# Patient Record
Sex: Female | Born: 1945 | Race: White | Hispanic: No | Smoking: Former smoker
Health system: Southern US, Community
[De-identification: ages and names within clinical notes are randomized; demographics above are authoritative.]

## PROBLEM LIST (undated history)

## (undated) ENCOUNTER — Emergency Department (HOSPITAL_COMMUNITY): Disposition: A | Discharge status: Home / Self Care | Payer: Medicare Other

## (undated) DIAGNOSIS — I499 Cardiac arrhythmia, unspecified: Secondary | ICD-10-CM

## (undated) DIAGNOSIS — M797 Fibromyalgia: Secondary | ICD-10-CM

## (undated) DIAGNOSIS — F0390 Unspecified dementia without behavioral disturbance: Secondary | ICD-10-CM

## (undated) DIAGNOSIS — K589 Irritable bowel syndrome without diarrhea: Secondary | ICD-10-CM

## (undated) DIAGNOSIS — K219 Gastro-esophageal reflux disease without esophagitis: Secondary | ICD-10-CM

## (undated) DIAGNOSIS — K5190 Ulcerative colitis, unspecified, without complications: Secondary | ICD-10-CM

## (undated) DIAGNOSIS — F329 Major depressive disorder, single episode, unspecified: Secondary | ICD-10-CM

## (undated) DIAGNOSIS — I00 Rheumatic fever without heart involvement: Secondary | ICD-10-CM

## (undated) DIAGNOSIS — I639 Cerebral infarction, unspecified: Secondary | ICD-10-CM

## (undated) DIAGNOSIS — F419 Anxiety disorder, unspecified: Secondary | ICD-10-CM

## (undated) DIAGNOSIS — M353 Polymyalgia rheumatica: Secondary | ICD-10-CM

## (undated) DIAGNOSIS — E079 Disorder of thyroid, unspecified: Secondary | ICD-10-CM

## (undated) DIAGNOSIS — I10 Essential (primary) hypertension: Secondary | ICD-10-CM

## (undated) DIAGNOSIS — J45909 Unspecified asthma, uncomplicated: Secondary | ICD-10-CM

## (undated) DIAGNOSIS — E119 Type 2 diabetes mellitus without complications: Secondary | ICD-10-CM

## (undated) DIAGNOSIS — F32A Depression, unspecified: Secondary | ICD-10-CM

## (undated) DIAGNOSIS — H409 Unspecified glaucoma: Secondary | ICD-10-CM

## (undated) DIAGNOSIS — I2510 Atherosclerotic heart disease of native coronary artery without angina pectoris: Secondary | ICD-10-CM

## (undated) DIAGNOSIS — E039 Hypothyroidism, unspecified: Secondary | ICD-10-CM

## (undated) HISTORY — DX: Ulcerative colitis, unspecified, without complications: K51.90

## (undated) HISTORY — PX: ELBOW DEBRIDEMENT: SHX931

## (undated) HISTORY — DX: Polymyalgia rheumatica: M35.3

## (undated) HISTORY — PX: CHOLECYSTECTOMY: SHX55

## (undated) HISTORY — PX: THYROID SURGERY: SHX805

## (undated) HISTORY — PX: ABDOMINAL HYSTERECTOMY: SHX81

---

## 2001-04-28 ENCOUNTER — Other Ambulatory Visit: Admission: RE | Admit: 2001-04-28 | Discharge: 2001-04-28 | Payer: Self-pay | Admitting: Orthopaedic Surgery

## 2011-02-05 HISTORY — PX: COLONOSCOPY: SHX174

## 2011-03-01 ENCOUNTER — Ambulatory Visit: Payer: Self-pay | Admitting: Endocrinology

## 2011-06-05 ENCOUNTER — Encounter: Payer: Self-pay | Admitting: Gastroenterology

## 2012-10-19 ENCOUNTER — Other Ambulatory Visit: Payer: Self-pay | Admitting: Neurosurgery

## 2012-10-19 DIAGNOSIS — M549 Dorsalgia, unspecified: Secondary | ICD-10-CM

## 2013-03-12 ENCOUNTER — Other Ambulatory Visit: Payer: Self-pay | Admitting: Neurosurgery

## 2013-03-12 DIAGNOSIS — M48061 Spinal stenosis, lumbar region without neurogenic claudication: Secondary | ICD-10-CM

## 2013-03-15 ENCOUNTER — Ambulatory Visit
Admission: RE | Admit: 2013-03-15 | Discharge: 2013-03-15 | Disposition: A | Discharge status: Home / Self Care | Payer: Medicare Other | Source: Ambulatory Visit | Attending: Neurosurgery | Admitting: Neurosurgery

## 2013-03-15 VITALS — BP 86/34 | HR 70

## 2013-03-15 DIAGNOSIS — M48061 Spinal stenosis, lumbar region without neurogenic claudication: Secondary | ICD-10-CM

## 2013-03-15 MED ORDER — IOHEXOL 180 MG/ML  SOLN
15.0000 mL | Freq: Once | INTRAMUSCULAR | Status: AC | PRN
Start: 2013-03-15 — End: 2013-03-15
  Administered 2013-03-15: 15 mL via INTRATHECAL

## 2013-03-15 MED ORDER — MEPERIDINE HCL 100 MG/ML IJ SOLN
75.0000 mg | Freq: Once | INTRAMUSCULAR | Status: AC
  Administered 2013-03-15: 75 mg via INTRAMUSCULAR

## 2013-03-15 MED ORDER — DIAZEPAM 5 MG PO TABS
5.0000 mg | ORAL_TABLET | Freq: Once | ORAL | Status: AC
  Administered 2013-03-15: 5 mg via ORAL

## 2013-03-15 MED ORDER — ONDANSETRON HCL 4 MG/2ML IJ SOLN
4.0000 mg | Freq: Once | INTRAMUSCULAR | Status: AC
  Administered 2013-03-15: 4 mg via INTRAMUSCULAR

## 2013-03-15 NOTE — Discharge Instructions (Signed)
Myelogram Discharge Instructions  1. Go home and rest quietly for the next 24 hours.  It is important to lie flat for the next 24 hours.  Get up only to go to the restroom.  You may lie in the bed or on a couch on your back, your stomach, your left side or your right side.  You may have one pillow under your head.  You may have pillows between your knees while you are on your side or under your knees while you are on your back.  2. DO NOT drive today.  Recline the seat as far back as it will go, while still wearing your seat belt, on the way home.  3. You may get up to go to the bathroom as needed.  You may sit up for 10 minutes to eat.  You may resume your normal diet and medications unless otherwise indicated.  Drink lots of extra fluids today and tomorrow.  4. The incidence of headache, nausea, or vomiting is about 5% (one in 20 patients).  If you develop a headache, lie flat and drink plenty of fluids until the headache goes away.  Caffeinated beverages may be helpful.  If you develop severe nausea and vomiting or a headache that does not go away with flat bed rest, call [phone].  5. You may resume normal activities after your 24 hours of bed rest is over; however, do not exert yourself strongly or do any heavy lifting tomorrow. If when you get up you have a headache when standing, go back to bed and force fluids for another 24 hours.  6. Call your physician for a follow-up appointment.  The results of your myelogram will be sent directly to your physician by the following day.  7. If you have any questions or if complications develop after you arrive home, please call [phone].  Discharge instructions have been explained to the patient.  The patient, or the person responsible for the patient, fully understands these instructions.      May resume imitrex on Feb. 10, 2015 after 1:00 pm.

## 2013-03-15 NOTE — Progress Notes (Signed)
Pt returned from myelo, skin is warm and dry and pt is awake and alert.

## 2013-03-15 NOTE — Progress Notes (Addendum)
Patient states she has been off Imitrex for at least the past two days.  Patient states she took a Benadryl at 11:00am today.

## 2014-01-25 ENCOUNTER — Emergency Department (HOSPITAL_COMMUNITY): Payer: Medicare Other

## 2014-01-25 ENCOUNTER — Emergency Department (HOSPITAL_COMMUNITY)
Admission: EM | Admit: 2014-01-25 | Discharge: 2014-01-25 | Disposition: A | Discharge status: Home / Self Care | Payer: Medicare Other | Attending: Emergency Medicine | Admitting: Emergency Medicine

## 2014-01-25 ENCOUNTER — Encounter (HOSPITAL_COMMUNITY): Payer: Self-pay | Admitting: Emergency Medicine

## 2014-01-25 DIAGNOSIS — R110 Nausea: Secondary | ICD-10-CM | POA: Insufficient documentation

## 2014-01-25 DIAGNOSIS — Z79899 Other long term (current) drug therapy: Secondary | ICD-10-CM | POA: Insufficient documentation

## 2014-01-25 DIAGNOSIS — Z8739 Personal history of other diseases of the musculoskeletal system and connective tissue: Secondary | ICD-10-CM | POA: Insufficient documentation

## 2014-01-25 DIAGNOSIS — E119 Type 2 diabetes mellitus without complications: Secondary | ICD-10-CM | POA: Diagnosis not present

## 2014-01-25 DIAGNOSIS — I2510 Atherosclerotic heart disease of native coronary artery without angina pectoris: Secondary | ICD-10-CM | POA: Insufficient documentation

## 2014-01-25 DIAGNOSIS — R1013 Epigastric pain: Secondary | ICD-10-CM | POA: Diagnosis not present

## 2014-01-25 DIAGNOSIS — Z87891 Personal history of nicotine dependence: Secondary | ICD-10-CM | POA: Diagnosis not present

## 2014-01-25 DIAGNOSIS — R42 Dizziness and giddiness: Secondary | ICD-10-CM | POA: Insufficient documentation

## 2014-01-25 DIAGNOSIS — E079 Disorder of thyroid, unspecified: Secondary | ICD-10-CM | POA: Diagnosis not present

## 2014-01-25 DIAGNOSIS — Z9049 Acquired absence of other specified parts of digestive tract: Secondary | ICD-10-CM | POA: Insufficient documentation

## 2014-01-25 DIAGNOSIS — R109 Unspecified abdominal pain: Secondary | ICD-10-CM | POA: Diagnosis present

## 2014-01-25 DIAGNOSIS — Z8719 Personal history of other diseases of the digestive system: Secondary | ICD-10-CM | POA: Insufficient documentation

## 2014-01-25 HISTORY — DX: Disorder of thyroid, unspecified: E07.9

## 2014-01-25 HISTORY — DX: Atherosclerotic heart disease of native coronary artery without angina pectoris: I25.10

## 2014-01-25 HISTORY — DX: Type 2 diabetes mellitus without complications: E11.9

## 2014-01-25 HISTORY — DX: Irritable bowel syndrome, unspecified: K58.9

## 2014-01-25 HISTORY — DX: Fibromyalgia: M79.7

## 2014-01-25 LAB — COMPREHENSIVE METABOLIC PANEL
ALT: 51 U/L — ABNORMAL HIGH (ref 0–35)
ANION GAP: 9 (ref 5–15)
AST: 55 U/L — ABNORMAL HIGH (ref 0–37)
Albumin: 4.1 g/dL (ref 3.5–5.2)
Alkaline Phosphatase: 75 U/L (ref 39–117)
BILIRUBIN TOTAL: 0.7 mg/dL (ref 0.3–1.2)
BUN: 11 mg/dL (ref 6–23)
CO2: 23 mmol/L (ref 19–32)
CREATININE: 0.7 mg/dL (ref 0.50–1.10)
Calcium: 9.5 mg/dL (ref 8.4–10.5)
Chloride: 106 mEq/L (ref 96–112)
GFR calc Af Amer: >90 mL/min (ref >90)
GFR calc non Af Amer: 86 mL/min — ABNORMAL LOW (ref >90)
Glucose, Bld: 129 mg/dL — ABNORMAL HIGH (ref 70–99)
Potassium: 3.4 mmol/L — ABNORMAL LOW (ref 3.5–5.1)
Sodium: 138 mmol/L (ref 135–145)
TOTAL PROTEIN: 7.2 g/dL (ref 6.0–8.3)

## 2014-01-25 LAB — LIPASE, BLOOD: LIPASE: 26 U/L (ref 11–59)

## 2014-01-25 LAB — CBC WITH DIFFERENTIAL/PLATELET
Basophils Absolute: 0 10*3/uL (ref 0.0–0.1)
Basophils Relative: 0 % (ref 0–1)
EOS ABS: 0.1 10*3/uL (ref 0.0–0.7)
Eosinophils Relative: 1 % (ref 0–5)
HCT: 45.3 % (ref 36.0–46.0)
HEMOGLOBIN: 15.8 g/dL — AB (ref 12.0–15.0)
Lymphocytes Relative: 39 % (ref 12–46)
Lymphs Abs: 4.4 10*3/uL — ABNORMAL HIGH (ref 0.7–4.0)
MCH: 30.9 pg (ref 26.0–34.0)
MCHC: 34.9 g/dL (ref 30.0–36.0)
MCV: 88.6 fL (ref 78.0–100.0)
MONO ABS: 1.2 10*3/uL — AB (ref 0.1–1.0)
MONOS PCT: 11 % (ref 3–12)
Neutro Abs: 5.5 10*3/uL (ref 1.7–7.7)
Neutrophils Relative %: 49 % (ref 43–77)
Platelets: 200 10*3/uL (ref 150–400)
RBC: 5.11 MIL/uL (ref 3.87–5.11)
RDW: 12.3 % (ref 11.5–15.5)
WBC: 11.2 10*3/uL — ABNORMAL HIGH (ref 4.0–10.5)

## 2014-01-25 MED ORDER — ONDANSETRON HCL 4 MG/2ML IJ SOLN
4.0000 mg | Freq: Once | INTRAMUSCULAR | Status: AC
  Administered 2014-01-25: 4 mg via INTRAVENOUS
  Filled 2014-01-25: qty 2

## 2014-01-25 MED ORDER — ONDANSETRON 4 MG PO TBDP: 4.0000 mg | ORAL_TABLET | Freq: Three times a day (TID) | ORAL | Status: DC | PRN

## 2014-01-25 MED ORDER — FAMOTIDINE 20 MG PO TABS: 20.0000 mg | ORAL_TABLET | Freq: Two times a day (BID) | ORAL | Status: DC

## 2014-01-25 MED ORDER — HYDROMORPHONE HCL 1 MG/ML IJ SOLN
0.5000 mg | Freq: Once | INTRAMUSCULAR | Status: AC
  Administered 2014-01-25: 0.5 mg via INTRAVENOUS
  Filled 2014-01-25: qty 1

## 2014-01-25 MED ORDER — SODIUM CHLORIDE 0.9 % IV SOLN
INTRAVENOUS | Status: DC
  Administered 2014-01-25: 14:00:00 via INTRAVENOUS

## 2014-01-25 MED ORDER — TRAMADOL HCL 50 MG PO TABS: 50.0000 mg | ORAL_TABLET | Freq: Four times a day (QID) | ORAL | Status: DC | PRN

## 2014-01-25 MED ORDER — SODIUM CHLORIDE 0.9 % IV BOLUS (SEPSIS)
250.0000 mL | Freq: Once | INTRAVENOUS | Status: AC
  Administered 2014-01-25: 250 mL via INTRAVENOUS

## 2014-01-25 MED ORDER — PANTOPRAZOLE SODIUM 40 MG IV SOLR
40.0000 mg | Freq: Once | INTRAVENOUS | Status: AC
  Administered 2014-01-25: 40 mg via INTRAVENOUS
  Filled 2014-01-25: qty 40

## 2014-01-25 NOTE — ED Notes (Signed)
PT c/o epigastric pain and constant nausea x5 days. PT denies any vomiting or diarrhea. Last BM today normal as reported by PT. PT denies any Urinary symptoms.

## 2014-01-25 NOTE — ED Notes (Signed)
MD at bedside. 

## 2014-01-25 NOTE — Discharge Instructions (Signed)
Workup with a CT scan and labs without any specific findings. This may mean that this is related to a stomach ulcer. Take the Pepcid as directed. Return for any new or worse symptoms. Take pain medicine as needed. Follow-up with your doctor in the next few days or after the holidays. Take the Pepcid at least for the next 2 weeks.

## 2014-01-25 NOTE — ED Notes (Signed)
Pt reports onset of abdominal pain on Thurs. Nausea and RUQ abdominal pain. No emesis. Pt has hx of pancreatitis.

## 2014-01-25 NOTE — ED Provider Notes (Signed)
CSN: [id number]     Arrival date & time 01/25/14  1233 History  This chart was scribed for Fredia Sorrow, MD by Zola Button, ED Scribe. This patient was seen in room [facility]17/[facility] and the patient's care was started at 1:40 PM.      Chief Complaint  Patient presents with  . Abdominal Pain    Patient is a 68 y.o. female presenting with abdominal pain. The history is provided by the patient. No language interpreter was used.  Abdominal Pain Pain location:  Epigastric Pain radiates to:  Does not radiate Onset quality:  Gradual Duration:  5 days Timing:  Intermittent Progression:  Waxing and waning Chronicity:  New Relieved by:  None tried Worsened by:  Eating Ineffective treatments:  None tried Associated symptoms: cough, nausea and shortness of breath   Associated symptoms: no chest pain, no chills, no diarrhea, no dysuria, no fever and no vomiting    HPI Comments: Tina Schultz is a 68 y.o. female with a hx of cholecystectomy, several stomach ulcers and DM who presents to the Emergency Department complaining of gradual onset, intermittent, non-radiating epigastric abdominal pain that began 5 days ago. The pain is described as a 8/10 when most severe and 3/10 when least severe. Patient has been eating less because eating worsens the pain; she feels increased pain immediately after eating. She also reports having cough for a month due to asthmatic bronchitis, SOB and swelling in both ankles. She denies fever, chills, vomiting, and diarrhea.   Patient is newly diabetic; she was started on metformin 10 days ago. She had concern for pancreatitis with her current symptoms, but she does not have hx of pancreatitis.  PCP: Dr. Kern Alberta in [place]  Past Medical History  Diagnosis Date  . Diabetes mellitus without complication   . Fibromyalgia   . Thyroid disease   . Coronary artery disease   . IBS (irritable bowel syndrome)    Past Surgical History  Procedure Laterality Date  .  Thyroid surgery    . Abdominal hysterectomy    . Cholecystectomy    . Elbow debridement     Family History  Problem Relation Age of Onset  . Cancer Mother   . Stroke Father   . Addison's disease Sister   . Alzheimer's disease Brother    History  Substance Use Topics  . Smoking status: Former Research scientist (life sciences)  . Smokeless tobacco: Never Used  . Alcohol Use: No   OB History    Gravida Para Term Preterm AB TAB SAB Ectopic Multiple Living   4 3 3  1  1         Review of Systems  Constitutional: Negative for fever and chills.  Eyes: Negative for visual disturbance.  Respiratory: Positive for cough and shortness of breath.   Cardiovascular: Positive for leg swelling. Negative for chest pain.  Gastrointestinal: Positive for nausea and abdominal pain. Negative for vomiting and diarrhea.  Genitourinary: Negative for dysuria.  Musculoskeletal: Negative for back pain.  Skin: Negative for rash.  Neurological: Positive for dizziness and light-headedness. Negative for syncope and headaches.  Hematological: Does not bruise/bleed easily.  Psychiatric/Behavioral: Negative for confusion.      Allergies  Contrast media; Aspirin; Codeine; Morphine and related; and Sulfa antibiotics  Home Medications   Prior to Admission medications   Medication Sig Start Date End Date Taking? Authorizing Provider  atenolol (TENORMIN) 50 MG tablet Take 50 mg by mouth daily.   Yes Historical Provider, MD  gabapentin (NEURONTIN) 600  MG tablet Take 600 mg by mouth 3 (three) times daily.   Yes Historical Provider, MD  levothyroxine (SYNTHROID, LEVOTHROID) 112 MCG tablet Take 112 mcg by mouth daily before breakfast.   Yes Historical Provider, MD  lisinopril (PRINIVIL,ZESTRIL) 5 MG tablet Take 5 mg by mouth daily.   Yes Historical Provider, MD  metFORMIN (GLUCOPHAGE-XR) 500 MG 24 hr tablet Take 1,000 mg by mouth daily with breakfast.   Yes Historical Provider, MD  omeprazole (PRILOSEC) 20 MG capsule Take 20 mg by mouth  daily.   Yes Historical Provider, MD  famotidine (PEPCID) 20 MG tablet Take 1 tablet (20 mg total) by mouth 2 (two) times daily. 01/25/14   Fredia Sorrow, MD  ondansetron (ZOFRAN ODT) 4 MG disintegrating tablet Take 1 tablet (4 mg total) by mouth every 8 (eight) hours as needed. 01/25/14   Fredia Sorrow, MD  traMADol (ULTRAM) 50 MG tablet Take 1 tablet (50 mg total) by mouth every 6 (six) hours as needed. 01/25/14   Fredia Sorrow, MD   BP 157/90 mmHg  Pulse 104  Temp(Src) 98 F (36.7 C)  Resp 17  Ht 5' 4"  (1.626 m)  Wt 174 lb (78.926 kg)  BMI 29.85 kg/m2  SpO2 94% Physical Exam  Constitutional: She is oriented to person, place, and time. She appears well-developed and well-nourished. No distress.  HENT:  Head: Normocephalic and atraumatic.  Mouth/Throat: Oropharynx is clear and moist. No oropharyngeal exudate.  Eyes: Pupils are equal, round, and reactive to light.  Neck: Neck supple.  Cardiovascular: Normal rate, regular rhythm and normal heart sounds.   Pulmonary/Chest: Effort normal and breath sounds normal. No respiratory distress. She has no wheezes. She has no rales.  Abdominal: Bowel sounds are normal. There is tenderness. There is no guarding.  Tenderness in epigastric region, no tenderness elsewhere. No guarding.  Musculoskeletal: She exhibits no edema.  Neurological: She is alert and oriented to person, place, and time. No cranial nerve deficit.  Skin: Skin is warm and dry. No rash noted.  Psychiatric: She has a normal mood and affect. Her behavior is normal.  Nursing note and vitals reviewed.   ED Course  Procedures  DIAGNOSTIC STUDIES: Oxygen Saturation is 98% on room air, normal by my interpretation.    COORDINATION OF CARE: 1:48 PM-Discussed treatment plan which includes CXR, CT Abdomen Pelvis, medications and labs with pt at bedside and pt agreed to plan.    Labs Review Labs Reviewed  CBC WITH DIFFERENTIAL - Abnormal; Notable for the following:    WBC  11.2 (*)    Hemoglobin 15.8 (*)    Lymphs Abs 4.4 (*)    Monocytes Absolute 1.2 (*)    All other components within normal limits  COMPREHENSIVE METABOLIC PANEL - Abnormal; Notable for the following:    Potassium 3.4 (*)    Glucose, Bld 129 (*)    AST 55 (*)    ALT 51 (*)    GFR calc non Af Amer 86 (*)    All other components within normal limits  LIPASE, BLOOD    Imaging Review Ct Abdomen Pelvis Wo Contrast  01/25/2014   CLINICAL DATA:  68 year old female with epigastric pain, bloating and diarrhea.  EXAM: CT ABDOMEN AND PELVIS WITHOUT CONTRAST  TECHNIQUE: Multidetector CT imaging of the abdomen and pelvis was performed following the standard protocol without IV contrast.  COMPARISON:  Prior CT scan of the chest 01/27/2007  FINDINGS: Lower Chest: Small cluster of tree-in-bud nodularity in the inferolateral aspect of the  left lower lobe. Largest nodular component measures up to 5 mm. Otherwise, the lung bases are clear. The visualized heart is within normal limits for size. No pericardial effusion. Unremarkable visualized distal thoracic esophagus.  Abdomen: Unenhanced CT was performed per clinician order. Lack of IV contrast limits sensitivity and specificity, especially for evaluation of abdominal/pelvic solid viscera. Within these limitations, unremarkable CT appearance of the stomach, duodenum, spleen, adrenal glands and pancreas. Normal hepatic contour and morphology. No discrete hepatic lesion. Gallbladder is surgically absent. No intra or extrahepatic biliary ductal dilatation.  Unremarkable appearance of the bilateral kidneys. No focal solid lesion, hydronephrosis or nephrolithiasis.  Colonic diverticular disease without CT evidence of active inflammation. The appendix is surgically absent. No focal bowel wall thickening or evidence of obstruction. No free fluid or suspicious adenopathy.  Pelvis: Surgical changes of prior hysterectomy. The bladder is unremarkable. No free fluid or suspicious  adenopathy.  Bones/Soft Tissues: No acute fracture or aggressive appearing lytic or blastic osseous lesion. Multilevel degenerative disc disease with vacuum disc phenomenon at multiple levels.  Vascular: Limited evaluation in the absence of intravenous contrast. Scattered atherosclerotic vascular calcifications. No aneurysmal dilatation.  IMPRESSION: 1. No acute abnormality in the abdomen or pelvis to explain the patient's clinical symptoms. 2. Small isolated focus of tree-in-bud nodularity in the inferolateral aspect of the left lower lobe may represent the sequelae of a prior infectious/inflammatory or granulomatous process, or potentially an acute infectious/inflammatory process. The largest nodular component measures 5 mm in diameter. If the patient is at high risk for bronchogenic carcinoma, follow-up chest CT at 6-12 months is recommended. If the patient is at low risk for bronchogenic carcinoma, follow-up chest CT at 12 months is recommended. This recommendation follows the consensus statement: Guidelines for Management of Small Pulmonary Nodules Detected on CT Scans: A Statement from the [employer] as published in Radiology 2005;237:395-400. 3. Mild atherosclerotic vascular calcifications without aneurysmal dilatation. 4. Multilevel degenerative disc disease.   Electronically Signed   By: Jacqulynn Cadet M.D.   On: 01/25/2014 17:15   Dg Chest 2 View  01/25/2014   CLINICAL DATA:  Epigastric pain and nausea for 5 days.  Dizziness.  EXAM: CHEST  2 VIEW  COMPARISON:  None.  FINDINGS: There is eventration of right hemidiaphragm. The cardiomediastinal contours are normal. Minimal linear atelectasis in the lingula, the lungs are otherwise clear. Pulmonary vasculature is normal. No consolidation, pleural effusion, or pneumothorax. No acute osseous abnormalities are seen.  IMPRESSION: No acute pulmonary process.   Electronically Signed   By: Jeb Levering M.D.   On: 01/25/2014 15:14     EKG  Interpretation   Date/Time:  Tuesday January 25 2014 14:11:59 EST Ventricular Rate:  105 PR Interval:  172 QRS Duration: 88 QT Interval:  338 QTC Calculation: 447 R Axis:   39 Text Interpretation:  Sinus tachycardia Borderline repolarization  abnormality No previous ECGs available Confirmed by Gavin Telford  MD, Malene Blaydes  [phone]) on 01/25/2014 5:29:26 PM      MDM   Final diagnoses:  Epigastric abdominal pain   Patient's workup for the epigastric abdominal pain without evidence of pancreatitis. CT scan without any significant abnormalities. May represent peptic ulcer disease. Patient's had a history of that in the past. Will treat the visit this could be peptic ulcer disease and have her follow-up with her regular doctor.  I personally performed the services described in this documentation, which was scribed in my presence. The recorded information has been reviewed and is accurate.  Fredia Sorrow, MD 01/25/14 [phone]

## 2014-08-16 ENCOUNTER — Other Ambulatory Visit (HOSPITAL_COMMUNITY): Payer: Self-pay | Admitting: Family Medicine

## 2014-08-16 DIAGNOSIS — M79605 Pain in left leg: Secondary | ICD-10-CM

## 2014-08-16 DIAGNOSIS — M545 Low back pain: Secondary | ICD-10-CM

## 2014-08-23 ENCOUNTER — Ambulatory Visit (HOSPITAL_COMMUNITY)
Admission: RE | Admit: 2014-08-23 | Discharge: 2014-08-23 | Disposition: A | Discharge status: Home / Self Care | Payer: PPO | Source: Ambulatory Visit | Attending: Family Medicine | Admitting: Family Medicine

## 2014-08-23 DIAGNOSIS — M545 Low back pain: Secondary | ICD-10-CM | POA: Diagnosis present

## 2014-08-23 DIAGNOSIS — M4806 Spinal stenosis, lumbar region: Secondary | ICD-10-CM | POA: Insufficient documentation

## 2014-08-23 DIAGNOSIS — M5136 Other intervertebral disc degeneration, lumbar region: Secondary | ICD-10-CM | POA: Insufficient documentation

## 2014-08-23 DIAGNOSIS — M129 Arthropathy, unspecified: Secondary | ICD-10-CM | POA: Diagnosis not present

## 2014-08-23 DIAGNOSIS — M79605 Pain in left leg: Secondary | ICD-10-CM

## 2015-02-16 DIAGNOSIS — J0101 Acute recurrent maxillary sinusitis: Secondary | ICD-10-CM | POA: Diagnosis not present

## 2015-02-16 DIAGNOSIS — J209 Acute bronchitis, unspecified: Secondary | ICD-10-CM | POA: Diagnosis not present

## 2015-03-08 DIAGNOSIS — Z780 Asymptomatic menopausal state: Secondary | ICD-10-CM | POA: Diagnosis not present

## 2015-03-08 DIAGNOSIS — K219 Gastro-esophageal reflux disease without esophagitis: Secondary | ICD-10-CM | POA: Diagnosis not present

## 2015-03-08 DIAGNOSIS — M8589 Other specified disorders of bone density and structure, multiple sites: Secondary | ICD-10-CM | POA: Diagnosis not present

## 2015-03-08 DIAGNOSIS — Z87891 Personal history of nicotine dependence: Secondary | ICD-10-CM | POA: Diagnosis not present

## 2015-03-08 DIAGNOSIS — E119 Type 2 diabetes mellitus without complications: Secondary | ICD-10-CM | POA: Diagnosis not present

## 2015-03-08 DIAGNOSIS — M8580 Other specified disorders of bone density and structure, unspecified site: Secondary | ICD-10-CM | POA: Diagnosis not present

## 2015-03-08 DIAGNOSIS — E039 Hypothyroidism, unspecified: Secondary | ICD-10-CM | POA: Diagnosis not present

## 2015-03-08 DIAGNOSIS — Z79899 Other long term (current) drug therapy: Secondary | ICD-10-CM | POA: Diagnosis not present

## 2015-03-14 DIAGNOSIS — H401131 Primary open-angle glaucoma, bilateral, mild stage: Secondary | ICD-10-CM | POA: Diagnosis not present

## 2015-04-15 DIAGNOSIS — J0191 Acute recurrent sinusitis, unspecified: Secondary | ICD-10-CM | POA: Diagnosis not present

## 2015-04-15 DIAGNOSIS — E039 Hypothyroidism, unspecified: Secondary | ICD-10-CM | POA: Diagnosis not present

## 2015-04-15 DIAGNOSIS — E1165 Type 2 diabetes mellitus with hyperglycemia: Secondary | ICD-10-CM | POA: Diagnosis not present

## 2015-05-22 DIAGNOSIS — E1165 Type 2 diabetes mellitus with hyperglycemia: Secondary | ICD-10-CM | POA: Diagnosis not present

## 2015-05-22 DIAGNOSIS — I10 Essential (primary) hypertension: Secondary | ICD-10-CM | POA: Diagnosis not present

## 2015-05-22 DIAGNOSIS — K219 Gastro-esophageal reflux disease without esophagitis: Secondary | ICD-10-CM | POA: Diagnosis not present

## 2015-05-22 DIAGNOSIS — E038 Other specified hypothyroidism: Secondary | ICD-10-CM | POA: Diagnosis not present

## 2015-05-24 DIAGNOSIS — F331 Major depressive disorder, recurrent, moderate: Secondary | ICD-10-CM | POA: Diagnosis not present

## 2015-05-24 DIAGNOSIS — E039 Hypothyroidism, unspecified: Secondary | ICD-10-CM | POA: Diagnosis not present

## 2015-05-24 DIAGNOSIS — E1165 Type 2 diabetes mellitus with hyperglycemia: Secondary | ICD-10-CM | POA: Diagnosis not present

## 2015-05-24 DIAGNOSIS — Z1389 Encounter for screening for other disorder: Secondary | ICD-10-CM | POA: Diagnosis not present

## 2015-05-24 DIAGNOSIS — K219 Gastro-esophageal reflux disease without esophagitis: Secondary | ICD-10-CM | POA: Diagnosis not present

## 2015-05-24 DIAGNOSIS — I10 Essential (primary) hypertension: Secondary | ICD-10-CM | POA: Diagnosis not present

## 2015-05-24 DIAGNOSIS — E8881 Metabolic syndrome: Secondary | ICD-10-CM | POA: Diagnosis not present

## 2015-05-24 DIAGNOSIS — M797 Fibromyalgia: Secondary | ICD-10-CM | POA: Diagnosis not present

## 2015-06-20 DIAGNOSIS — H2513 Age-related nuclear cataract, bilateral: Secondary | ICD-10-CM | POA: Diagnosis not present

## 2015-06-20 DIAGNOSIS — H401131 Primary open-angle glaucoma, bilateral, mild stage: Secondary | ICD-10-CM | POA: Diagnosis not present

## 2015-06-20 DIAGNOSIS — E119 Type 2 diabetes mellitus without complications: Secondary | ICD-10-CM | POA: Diagnosis not present

## 2015-09-19 DIAGNOSIS — H401131 Primary open-angle glaucoma, bilateral, mild stage: Secondary | ICD-10-CM | POA: Diagnosis not present

## 2015-10-10 DIAGNOSIS — E8881 Metabolic syndrome: Secondary | ICD-10-CM | POA: Diagnosis not present

## 2015-10-10 DIAGNOSIS — E1165 Type 2 diabetes mellitus with hyperglycemia: Secondary | ICD-10-CM | POA: Diagnosis not present

## 2015-10-10 DIAGNOSIS — Z6823 Body mass index (BMI) 23.0-23.9, adult: Secondary | ICD-10-CM | POA: Diagnosis not present

## 2015-10-10 DIAGNOSIS — E039 Hypothyroidism, unspecified: Secondary | ICD-10-CM | POA: Diagnosis not present

## 2015-10-10 DIAGNOSIS — M797 Fibromyalgia: Secondary | ICD-10-CM | POA: Diagnosis not present

## 2015-10-10 DIAGNOSIS — F331 Major depressive disorder, recurrent, moderate: Secondary | ICD-10-CM | POA: Diagnosis not present

## 2015-10-10 DIAGNOSIS — K219 Gastro-esophageal reflux disease without esophagitis: Secondary | ICD-10-CM | POA: Diagnosis not present

## 2015-10-10 DIAGNOSIS — I10 Essential (primary) hypertension: Secondary | ICD-10-CM | POA: Diagnosis not present

## 2015-12-04 DIAGNOSIS — E039 Hypothyroidism, unspecified: Secondary | ICD-10-CM | POA: Diagnosis not present

## 2015-12-14 DIAGNOSIS — Z23 Encounter for immunization: Secondary | ICD-10-CM | POA: Diagnosis not present

## 2016-01-04 DIAGNOSIS — H401131 Primary open-angle glaucoma, bilateral, mild stage: Secondary | ICD-10-CM | POA: Diagnosis not present

## 2016-02-19 DIAGNOSIS — J209 Acute bronchitis, unspecified: Secondary | ICD-10-CM | POA: Diagnosis not present

## 2016-02-19 DIAGNOSIS — J019 Acute sinusitis, unspecified: Secondary | ICD-10-CM | POA: Diagnosis not present

## 2016-02-19 DIAGNOSIS — Z6823 Body mass index (BMI) 23.0-23.9, adult: Secondary | ICD-10-CM | POA: Diagnosis not present

## 2016-03-11 DIAGNOSIS — R0602 Shortness of breath: Secondary | ICD-10-CM | POA: Diagnosis not present

## 2016-03-11 DIAGNOSIS — I10 Essential (primary) hypertension: Secondary | ICD-10-CM | POA: Diagnosis not present

## 2016-03-11 DIAGNOSIS — Z6822 Body mass index (BMI) 22.0-22.9, adult: Secondary | ICD-10-CM | POA: Diagnosis not present

## 2016-03-11 DIAGNOSIS — R072 Precordial pain: Secondary | ICD-10-CM | POA: Diagnosis not present

## 2016-03-28 ENCOUNTER — Emergency Department (HOSPITAL_COMMUNITY)
Admission: EM | Admit: 2016-03-28 | Discharge: 2016-03-28 | Disposition: A | Discharge status: Home / Self Care | Payer: PPO | Attending: Emergency Medicine | Admitting: Emergency Medicine

## 2016-03-28 ENCOUNTER — Encounter (HOSPITAL_COMMUNITY): Payer: Self-pay

## 2016-03-28 DIAGNOSIS — Z79899 Other long term (current) drug therapy: Secondary | ICD-10-CM | POA: Insufficient documentation

## 2016-03-28 DIAGNOSIS — R002 Palpitations: Secondary | ICD-10-CM | POA: Insufficient documentation

## 2016-03-28 DIAGNOSIS — I2510 Atherosclerotic heart disease of native coronary artery without angina pectoris: Secondary | ICD-10-CM | POA: Diagnosis not present

## 2016-03-28 DIAGNOSIS — E119 Type 2 diabetes mellitus without complications: Secondary | ICD-10-CM | POA: Diagnosis not present

## 2016-03-28 DIAGNOSIS — Z87891 Personal history of nicotine dependence: Secondary | ICD-10-CM | POA: Diagnosis not present

## 2016-03-28 DIAGNOSIS — R0602 Shortness of breath: Secondary | ICD-10-CM | POA: Insufficient documentation

## 2016-03-28 LAB — CBC WITH DIFFERENTIAL/PLATELET
BASOS ABS: 0 10*3/uL (ref 0.0–0.1)
BASOS PCT: 0 %
Eosinophils Absolute: 0.1 10*3/uL (ref 0.0–0.7)
Eosinophils Relative: 1 %
HCT: 40.1 % (ref 36.0–46.0)
HEMOGLOBIN: 14.1 g/dL (ref 12.0–15.0)
Lymphocytes Relative: 50 %
Lymphs Abs: 5.2 10*3/uL — ABNORMAL HIGH (ref 0.7–4.0)
MCH: 31.1 pg (ref 26.0–34.0)
MCHC: 35.2 g/dL (ref 30.0–36.0)
MCV: 88.3 fL (ref 78.0–100.0)
Monocytes Absolute: 0.5 10*3/uL (ref 0.1–1.0)
Monocytes Relative: 5 %
NEUTROS PCT: 44 %
Neutro Abs: 4.4 10*3/uL (ref 1.7–7.7)
Platelets: 226 10*3/uL (ref 150–400)
RBC: 4.54 MIL/uL (ref 3.87–5.11)
RDW: 12.4 % (ref 11.5–15.5)
WBC: 10.2 10*3/uL (ref 4.0–10.5)

## 2016-03-28 LAB — TROPONIN I

## 2016-03-28 LAB — COMPREHENSIVE METABOLIC PANEL
ALBUMIN: 4.5 g/dL (ref 3.5–5.0)
ALT: 25 U/L (ref 14–54)
ANION GAP: 9 (ref 5–15)
AST: 33 U/L (ref 15–41)
Alkaline Phosphatase: 68 U/L (ref 38–126)
BUN: 18 mg/dL (ref 6–20)
CO2: 24 mmol/L (ref 22–32)
Calcium: 9.5 mg/dL (ref 8.9–10.3)
Chloride: 103 mmol/L (ref 101–111)
Creatinine, Ser: 0.63 mg/dL (ref 0.44–1.00)
GFR calc Af Amer: >60 mL/min (ref >60)
Glucose, Bld: 124 mg/dL — ABNORMAL HIGH (ref 65–99)
POTASSIUM: 3.3 mmol/L — AB (ref 3.5–5.1)
Sodium: 136 mmol/L (ref 135–145)
Total Bilirubin: 0.4 mg/dL (ref 0.3–1.2)
Total Protein: 7.4 g/dL (ref 6.5–8.1)

## 2016-03-28 LAB — TSH: TSH: 1.167 u[IU]/mL (ref 0.350–4.500)

## 2016-03-28 LAB — BRAIN NATRIURETIC PEPTIDE: B NATRIURETIC PEPTIDE 5: 32 pg/mL (ref 0.0–100.0)

## 2016-03-28 MED ORDER — SODIUM CHLORIDE 0.9 % IV BOLUS (SEPSIS)
500.0000 mL | Freq: Once | INTRAVENOUS | Status: AC
  Administered 2016-03-28: 500 mL via INTRAVENOUS

## 2016-03-28 NOTE — Discharge Instructions (Signed)
Increase your atenolol to 50 mg daily.  Follow-up with cardiology in the next week. The contact information for the results cardiology clinic has been provided in this discharge summary for you to call and make these arrangements.  Return to the emergency department if you develop chest pain, worsening breathing, high fevers, or other new and concerning symptoms.

## 2016-03-28 NOTE — ED Triage Notes (Signed)
Reports of palpitations x6 weeks but worse today with shortness of breath.  States she has seen Dr. Olena Heckle for complaint. Denies chest pain. Patient states she can be sitting at home watching tv and feel like she is going to pass out.

## 2016-03-28 NOTE — ED Provider Notes (Signed)
[facility] DEPT Provider Note   CSN: [id number] Arrival date & time: 03/28/16  1650     History   Chief Complaint Chief Complaint  Patient presents with  . Palpitations    HPI Tina Schultz is a 71 y.o. female.  Patient is a 71 year old female with past medical history of diabetes, coronary artery disease, fibromyalgia, thyroid disease. She presents for evaluation of palpitations. This been occurring intermittently for the past 6 weeks. She was seen 2 weeks ago by a cardiologist who ran tests, however found no obvious problems. She presents today with palpitations and shortness of breath. She denies any chest pain. She denies any fevers, chills, or cough.   The history is provided by the patient.  Palpitations   This is a new problem. Episode onset: Weeks weeks ago. Episode frequency: Intermittently. The problem has been gradually worsening. Associated symptoms include shortness of breath. Pertinent negatives include no diaphoresis, no fever, no vomiting and no cough.    Past Medical History:  Diagnosis Date  . Coronary artery disease   . Diabetes mellitus without complication ([place])   . Fibromyalgia   . IBS (irritable bowel syndrome)   . Thyroid disease     There are no active problems to display for this patient.   Past Surgical History:  Procedure Laterality Date  . ABDOMINAL HYSTERECTOMY    . CHOLECYSTECTOMY    . ELBOW DEBRIDEMENT    . THYROID SURGERY      OB History    Gravida Para Term Preterm AB Living   4 3 3   1      SAB TAB Ectopic Multiple Live Births   1               Home Medications    Prior to Admission medications   Medication Sig Start Date End Date Taking? Authorizing Provider  atenolol (TENORMIN) 50 MG tablet Take 50 mg by mouth daily.    Historical Provider, MD  famotidine (PEPCID) 20 MG tablet Take 1 tablet (20 mg total) by mouth 2 (two) times daily. 01/25/14   Fredia Sorrow, MD  gabapentin (NEURONTIN) 600 MG tablet Take 600 mg  by mouth 3 (three) times daily.    Historical Provider, MD  levothyroxine (SYNTHROID, LEVOTHROID) 112 MCG tablet Take 112 mcg by mouth daily before breakfast.    Historical Provider, MD  lisinopril (PRINIVIL,ZESTRIL) 5 MG tablet Take 5 mg by mouth daily.    Historical Provider, MD  metFORMIN (GLUCOPHAGE-XR) 500 MG 24 hr tablet Take 1,000 mg by mouth daily with breakfast.    Historical Provider, MD  omeprazole (PRILOSEC) 20 MG capsule Take 20 mg by mouth daily.    Historical Provider, MD  ondansetron (ZOFRAN ODT) 4 MG disintegrating tablet Take 1 tablet (4 mg total) by mouth every 8 (eight) hours as needed. 01/25/14   Fredia Sorrow, MD  traMADol (ULTRAM) 50 MG tablet Take 1 tablet (50 mg total) by mouth every 6 (six) hours as needed. 01/25/14   Fredia Sorrow, MD    Family History Family History  Problem Relation Age of Onset  . Cancer Mother   . Stroke Father   . Addison's disease Sister   . Alzheimer's disease Brother     Social History Social History  Substance Use Topics  . Smoking status: Former Research scientist (life sciences)  . Smokeless tobacco: Never Used  . Alcohol use No     Allergies   Contrast media [iodinated diagnostic agents]; Aspirin; Codeine; Morphine and related; and Sulfa antibiotics  Review of Systems Review of Systems  Constitutional: Negative for diaphoresis and fever.  Respiratory: Positive for shortness of breath. Negative for cough.   Cardiovascular: Positive for palpitations.  Gastrointestinal: Negative for vomiting.  All other systems reviewed and are negative.    Physical Exam Updated Vital Signs BP 180/73 (BP Location: Left Arm)   Pulse 105   Temp 98.9 F (37.2 C) (Oral)   Resp 14   Ht 5' 4"  (1.626 m)   Wt 132 lb (59.9 kg)   SpO2 100%   BMI 22.66 kg/m   Physical Exam  Constitutional: She is oriented to person, place, and time. She appears well-developed and well-nourished. No distress.  HENT:  Head: Normocephalic and atraumatic.  Mouth/Throat:  Oropharynx is clear and moist.  Neck: Normal range of motion. Neck supple.  Cardiovascular: Normal rate and regular rhythm.  Exam reveals no gallop and no friction rub.   No murmur heard. Pulmonary/Chest: Effort normal and breath sounds normal. No respiratory distress. She has no wheezes.  Abdominal: Soft. Bowel sounds are normal. She exhibits no distension. There is no tenderness.  Musculoskeletal: Normal range of motion.  Neurological: She is alert and oriented to person, place, and time.  Skin: Skin is warm and dry. She is not diaphoretic.  Nursing note and vitals reviewed.    ED Treatments / Results  Labs (all labs ordered are listed, but only abnormal results are displayed) Labs Reviewed  COMPREHENSIVE METABOLIC PANEL  TROPONIN I  CBC WITH DIFFERENTIAL/PLATELET  TSH    EKG  EKG Interpretation  Date/Time:  Thursday March 28 2016 16:59:27 EST Ventricular Rate:  104 PR Interval:    QRS Duration: 91 QT Interval:  332 QTC Calculation: 437 R Axis:   67 Text Interpretation:  Sinus tachycardia Borderline repolarization abnormality Confirmed by Noura Purpura  MD, Yazmeen Woolf ([id number]) on 03/28/2016 5:02:06 PM       Radiology No results found.  Procedures Procedures (including critical care time)  Medications Ordered in ED Medications  sodium chloride 0.9 % bolus 500 mL (not administered)     Initial Impression / Assessment and Plan / ED Course  I have reviewed the triage vital signs and the nursing notes.  Pertinent labs & imaging results that were available during my care of the patient were reviewed by me and considered in my medical decision making (see chart for details).  Workup reveals no obvious cause for her palpitations. She was remained in a normal sinus rhythm throughout her emergency department stay. There is no hypoxia and laboratory studies are unremarkable. I highly doubt pulmonary embolism. She will be given the follow-up information for cardiology to discuss a  possible Holter monitor. I will also advise her to resume the normal dose of atenolol that she had been taking prior to it being decreased.  Final Clinical Impressions(s) / ED Diagnoses   Final diagnoses:  None    New Prescriptions New Prescriptions   No medications on file     Veryl Speak, MD 03/28/16 1935

## 2016-04-03 ENCOUNTER — Encounter: Payer: Self-pay | Admitting: Cardiology

## 2016-04-03 ENCOUNTER — Ambulatory Visit (INDEPENDENT_AMBULATORY_CARE_PROVIDER_SITE_OTHER): Payer: PPO | Admitting: Cardiology

## 2016-04-03 VITALS — BP 110/64 | HR 65 | Ht 64.0 in | Wt 135.0 lb

## 2016-04-03 DIAGNOSIS — E876 Hypokalemia: Secondary | ICD-10-CM | POA: Diagnosis not present

## 2016-04-03 DIAGNOSIS — R002 Palpitations: Secondary | ICD-10-CM

## 2016-04-03 MED ORDER — POTASSIUM CHLORIDE CRYS ER 20 MEQ PO TBCR: EXTENDED_RELEASE_TABLET | ORAL | 0 refills | Status: DC

## 2016-04-03 NOTE — Patient Instructions (Signed)
Your physician recommends that you schedule a follow-up appointment in: TO BE DETERMINED AFTER TESTING  Your physician has recommended you make the following change in your medication:   START POTASSIUM 20 MEQ DAILY FOR 4 DAYS  Your physician has recommended that you wear an event monitor FOR 7 DAYS. Event monitors are medical devices that record the heart's electrical activity. Doctors most often [place] these monitors to diagnose arrhythmias. Arrhythmias are problems with the speed or rhythm of the heartbeat. The monitor is a small, portable device. You can wear one while you do your normal daily activities. This is usually used to diagnose what is causing palpitations/syncope (passing out).  Thank you for choosing [facility]!!

## 2016-04-03 NOTE — Progress Notes (Signed)
Clinical Summary Tina Schultz is a 71 y.o.female seen as new patient, she is referred by Tina Schultz.   1. Palpitations - seen in ER 03/28/16 with palpitations - symptoms started 8 weeks ago. Feeling of heart pounding, can feel dizzy. Occurs at rest or with exertion. Can get get diaphoretic. No SOB no chest pain. Lasts just a few seconds. Occurs several times a day. Can awake from sleep. No specific trigger - coffee x 2cups, no tea, no sodas, no energy drinks, no EtOH. Drinks 1 bottle of water. - she reports atenolol was decreased to 25 mg daily, orthostatic in clinic. During recent ER visit increased back to 83m daily. Symptopms have not imrpoved - TSH 1.167, K 3.3, Cr 0.63   2. CAD? - CAD is mentioned in her PMH. She unclear of this diagnosis [facility].    SH: her husband is Tina Schultz also a patient of mine.   Past Medical History:  Diagnosis Date  . Coronary artery disease   . Diabetes mellitus without complication ([place]   . Fibromyalgia   . IBS (irritable bowel syndrome)   . Thyroid disease      Allergies  Allergen Reactions  . Contrast Media [Iodinated Diagnostic Agents] Nausea And Vomiting and Other (See Comments)    Increased heart rate and sweating "a long time ago."  01/25/14 pt states she "went out" after receiving iv contrast 25 yrs ago [facility]  . Aspirin Other (See Comments)    Bleeding precautions secondary to ulcerative colitis.  . Codeine Nausea And Vomiting  . Morphine And Related Nausea And Vomiting  . Sulfa Antibiotics Nausea And Vomiting     Current Outpatient Prescriptions  Medication Sig Dispense Refill  . atenolol (TENORMIN) 50 MG tablet Take 25 mg by mouth daily.     . diphenhydramine-acetaminophen (TYLENOL PM) 25-500 MG TABS tablet Take 1 tablet by mouth at bedtime as needed.    .Marland Kitchenestradiol (ESTRACE) 0.5 MG tablet Take 0.5 mg by  mouth daily.    .Marland Kitchengabapentin (NEURONTIN) 600 MG tablet Take 600 mg by mouth 3 (three) times daily.    .Marland Kitchenlatanoprost (XALATAN) 0.005 % ophthalmic solution Place 1 drop into both eyes at bedtime.    .Marland Kitchenlevothyroxine (SYNTHROID, LEVOTHROID) 88 MCG tablet Take 88 mcg by mouth daily before breakfast.     . Multiple Vitamins-Minerals (CENTRUM SILVER ADULT 50+) TABS Take 1 tablet by mouth daily.    .Marland Kitchenomeprazole (PRILOSEC) 20 MG capsule Take 20 mg by mouth daily.    .Marland Kitchenvenlafaxine (EFFEXOR) 37.5 MG tablet Take 37.5 mg by mouth 2 (two) times daily.     No current facility-administered medications for this visit.      Past Surgical History:  Procedure Laterality Date  . ABDOMINAL HYSTERECTOMY    . CHOLECYSTECTOMY    . ELBOW DEBRIDEMENT    . THYROID SURGERY       Allergies  Allergen Reactions  . Contrast Media [Iodinated Diagnostic Agents] Nausea And Vomiting and Other (See Comments)    Increased heart rate and sweating "a long time ago."  01/25/14 pt states she "went out" after receiving iv contrast 25 yrs ago and [facility]  . Aspirin Other (See Comments)    Bleeding precautions secondary to ulcerative colitis.  . Codeine Nausea And Vomiting  .  Morphine And Related Nausea And Vomiting  . Sulfa Antibiotics Nausea And Vomiting      Family History  Problem Relation Age of Onset  . Cancer Mother   . Stroke Father   . Addison's disease Sister   . Alzheimer's disease Brother      Social History Tina Schultz reports that she has quit smoking. She has never used smokeless tobacco. Tina Schultz reports that she does not drink alcohol.   Review of Systems CONSTITUTIONAL: No weight loss, fever, chills, weakness or fatigue.  HEENT: Eyes: No visual loss, blurred vision, double vision or yellow sclerae.No hearing loss, sneezing, congestion, runny nose or sore throat.  SKIN: No rash or itching.  CARDIOVASCULAR: per HPI RESPIRATORY: No shortness of  breath, cough or sputum.  GASTROINTESTINAL: No anorexia, nausea, vomiting or diarrhea. No abdominal pain or blood.  GENITOURINARY: No burning on urination, no polyuria NEUROLOGICAL: No headache, dizziness, syncope, paralysis, ataxia, numbness or tingling in the extremities. No change in bowel or bladder control.  MUSCULOSKELETAL: No muscle, back pain, joint pain or stiffness.  LYMPHATICS: No enlarged nodes. No history of splenectomy.  PSYCHIATRIC: No history of depression or anxiety.  ENDOCRINOLOGIC: No reports of sweating, cold or heat intolerance. No polyuria or polydipsia.  Marland Kitchen   Physical Examination Vitals:   04/03/16 0909  BP: 110/64  Pulse: 65   Vitals:   04/03/16 0909  Weight: 135 lb (61.2 kg)  Height: 5' 4"  (1.626 m)    Gen: resting comfortably, no acute distress HEENT: no scleral icterus, pupils equal round and reactive, no palptable cervical adenopathy,  CV: RRR, no m/r/g, no jvd Resp: Clear to auscultation bilaterally GI: abdomen is soft, non-tender, non-distended, normal bowel sounds, no hepatosplenomegaly MSK: extremities are warm, no edema.  Skin: warm, no rash Neuro:  no focal deficits Psych: appropriate affect      Assessment and Plan  1. Palpitations - baseline EKG shows NSR - we will obtain 7 day montior to furterh evaluate  2. Hypokalemia - start KCl 48mq x 4 days.    F/u pending monitor results      JArnoldo Schultz M.D.

## 2016-04-08 DIAGNOSIS — R002 Palpitations: Secondary | ICD-10-CM | POA: Diagnosis not present

## 2016-04-09 ENCOUNTER — Ambulatory Visit (INDEPENDENT_AMBULATORY_CARE_PROVIDER_SITE_OTHER): Payer: PPO

## 2016-04-09 DIAGNOSIS — R002 Palpitations: Secondary | ICD-10-CM

## 2016-04-18 ENCOUNTER — Ambulatory Visit: Payer: PPO | Admitting: Cardiology

## 2016-04-30 ENCOUNTER — Telehealth: Payer: Self-pay | Admitting: *Deleted

## 2016-04-30 MED ORDER — ATENOLOL 25 MG PO TABS: 25.0000 mg | ORAL_TABLET | Freq: Two times a day (BID) | ORAL | 1 refills | Status: DC

## 2016-04-30 NOTE — Telephone Encounter (Signed)
-----   Message from Arnoldo Lenis, MD sent at 04/30/2016 10:03 AM EDT ----- Normal heart monitor. If still having symptoms of heart racing can increase her atenolol. Verify she is taking 40m daily, if so increase to 268mbid. F/u 3 months, she should call and update usKorearior is symptoms continue   J BrancH MD

## 2016-04-30 NOTE — Telephone Encounter (Signed)
Pt aware - still c/o heart racing and dizzy spells - verified atenolol 25 mg daily and pt will increase to bid - new rx sent to [facility] as requested - 3 month f/u scheduled and pt will call back if symptoms do not improve - routed to pcp

## 2016-05-01 DIAGNOSIS — E039 Hypothyroidism, unspecified: Secondary | ICD-10-CM | POA: Diagnosis not present

## 2016-05-01 DIAGNOSIS — E1165 Type 2 diabetes mellitus with hyperglycemia: Secondary | ICD-10-CM | POA: Diagnosis not present

## 2016-05-01 DIAGNOSIS — I10 Essential (primary) hypertension: Secondary | ICD-10-CM | POA: Diagnosis not present

## 2016-05-01 DIAGNOSIS — Z0001 Encounter for general adult medical examination with abnormal findings: Secondary | ICD-10-CM | POA: Diagnosis not present

## 2016-05-01 DIAGNOSIS — E8881 Metabolic syndrome: Secondary | ICD-10-CM | POA: Diagnosis not present

## 2016-05-01 DIAGNOSIS — M797 Fibromyalgia: Secondary | ICD-10-CM | POA: Diagnosis not present

## 2016-05-01 DIAGNOSIS — Z1212 Encounter for screening for malignant neoplasm of rectum: Secondary | ICD-10-CM | POA: Diagnosis not present

## 2016-05-01 DIAGNOSIS — K219 Gastro-esophageal reflux disease without esophagitis: Secondary | ICD-10-CM | POA: Diagnosis not present

## 2016-06-18 DIAGNOSIS — H401131 Primary open-angle glaucoma, bilateral, mild stage: Secondary | ICD-10-CM | POA: Diagnosis not present

## 2016-07-08 ENCOUNTER — Ambulatory Visit: Payer: PPO | Admitting: Cardiology

## 2016-08-20 DIAGNOSIS — R51 Headache: Secondary | ICD-10-CM | POA: Diagnosis not present

## 2016-08-20 DIAGNOSIS — Z6823 Body mass index (BMI) 23.0-23.9, adult: Secondary | ICD-10-CM | POA: Diagnosis not present

## 2016-08-20 DIAGNOSIS — R202 Paresthesia of skin: Secondary | ICD-10-CM | POA: Diagnosis not present

## 2016-08-22 ENCOUNTER — Ambulatory Visit: Payer: PPO | Admitting: Cardiology

## 2016-09-05 DIAGNOSIS — L718 Other rosacea: Secondary | ICD-10-CM | POA: Diagnosis not present

## 2016-09-06 DIAGNOSIS — Z9189 Other specified personal risk factors, not elsewhere classified: Secondary | ICD-10-CM | POA: Diagnosis not present

## 2016-09-06 DIAGNOSIS — E1165 Type 2 diabetes mellitus with hyperglycemia: Secondary | ICD-10-CM | POA: Diagnosis not present

## 2016-09-06 DIAGNOSIS — E039 Hypothyroidism, unspecified: Secondary | ICD-10-CM | POA: Diagnosis not present

## 2016-09-06 DIAGNOSIS — K219 Gastro-esophageal reflux disease without esophagitis: Secondary | ICD-10-CM | POA: Diagnosis not present

## 2016-09-06 DIAGNOSIS — E8881 Metabolic syndrome: Secondary | ICD-10-CM | POA: Diagnosis not present

## 2016-09-06 DIAGNOSIS — M797 Fibromyalgia: Secondary | ICD-10-CM | POA: Diagnosis not present

## 2016-09-06 DIAGNOSIS — I10 Essential (primary) hypertension: Secondary | ICD-10-CM | POA: Diagnosis not present

## 2016-09-10 DIAGNOSIS — E1165 Type 2 diabetes mellitus with hyperglycemia: Secondary | ICD-10-CM | POA: Diagnosis not present

## 2016-09-10 DIAGNOSIS — Z0001 Encounter for general adult medical examination with abnormal findings: Secondary | ICD-10-CM | POA: Diagnosis not present

## 2016-09-10 DIAGNOSIS — I10 Essential (primary) hypertension: Secondary | ICD-10-CM | POA: Diagnosis not present

## 2016-09-10 DIAGNOSIS — Z6822 Body mass index (BMI) 22.0-22.9, adult: Secondary | ICD-10-CM | POA: Diagnosis not present

## 2016-09-10 DIAGNOSIS — E039 Hypothyroidism, unspecified: Secondary | ICD-10-CM | POA: Diagnosis not present

## 2016-09-10 DIAGNOSIS — K5190 Ulcerative colitis, unspecified, without complications: Secondary | ICD-10-CM | POA: Diagnosis not present

## 2016-09-10 DIAGNOSIS — E8881 Metabolic syndrome: Secondary | ICD-10-CM | POA: Diagnosis not present

## 2016-09-10 DIAGNOSIS — M797 Fibromyalgia: Secondary | ICD-10-CM | POA: Diagnosis not present

## 2016-09-24 DIAGNOSIS — H2513 Age-related nuclear cataract, bilateral: Secondary | ICD-10-CM | POA: Diagnosis not present

## 2016-09-24 DIAGNOSIS — H401131 Primary open-angle glaucoma, bilateral, mild stage: Secondary | ICD-10-CM | POA: Diagnosis not present

## 2016-09-24 DIAGNOSIS — E119 Type 2 diabetes mellitus without complications: Secondary | ICD-10-CM | POA: Diagnosis not present

## 2016-09-24 DIAGNOSIS — H52203 Unspecified astigmatism, bilateral: Secondary | ICD-10-CM | POA: Diagnosis not present

## 2016-09-24 DIAGNOSIS — H5203 Hypermetropia, bilateral: Secondary | ICD-10-CM | POA: Diagnosis not present

## 2016-09-24 DIAGNOSIS — H524 Presbyopia: Secondary | ICD-10-CM | POA: Diagnosis not present

## 2016-10-02 NOTE — Patient Instructions (Signed)
Your procedure is scheduled on: 10/15/2016  Report to [facility] at  645  AM.  Call this number if you have problems the morning of surgery: [phone]   Do not eat food or drink liquids :After Midnight.      Take these medicines the morning of surgery with A SIP OF WATER: atenolol, neurontin, levothyroxine, lisinopril, prilosec.   Do not wear jewelry, make-up or nail polish.  Do not wear lotions, powders, or perfumes. You may wear deodorant.  Do not shave 48 hours prior to surgery.  Do not bring valuables to the hospital.  Contacts, dentures or bridgework may not be worn into surgery.  Leave suitcase in the car. After surgery it may be brought to your room.  For patients admitted to the hospital, checkout time is 11:00 AM the day of discharge.   Patients discharged the day of surgery will not be allowed to drive home.  :     Please read over the following fact sheets that you were given: Coughing and Deep Breathing, Surgical Site Infection Prevention, Anesthesia Post-op Instructions and Care and Recovery After Surgery    Cataract A cataract is a clouding of the lens of the eye. When a lens becomes cloudy, vision is reduced based on the degree and nature of the clouding. Many cataracts reduce vision to some degree. Some cataracts make people more near-sighted as they develop. Other cataracts increase glare. Cataracts that are ignored and become worse can sometimes look white. The white color can be seen through the pupil. CAUSES   Aging. However, cataracts may occur at any age, even in newborns.   Certain drugs.   Trauma to the eye.   Certain diseases such as diabetes.   Specific eye diseases such as chronic inflammation inside the eye or a sudden attack of a rare form of glaucoma.   Inherited or acquired medical problems.  SYMPTOMS   Gradual, progressive drop in vision in the affected eye.   Severe, rapid visual loss. This most often happens when trauma is the cause.    DIAGNOSIS  To detect a cataract, an eye doctor examines the lens. Cataracts are best diagnosed with an exam of the eyes with the pupils enlarged (dilated) by drops.  TREATMENT  For an early cataract, vision may improve by using different eyeglasses or stronger lighting. If that does not help your vision, surgery is the only effective treatment. A cataract needs to be surgically removed when vision loss interferes with your everyday activities, such as driving, reading, or watching TV. A cataract may also have to be removed if it prevents examination or treatment of another eye problem. Surgery removes the cloudy lens and usually replaces it with a substitute lens (intraocular lens, IOL).  At a time when both you and your doctor agree, the cataract will be surgically removed. If you have cataracts in both eyes, only one is usually removed at a time. This allows the operated eye to heal and be out of danger from any possible problems after surgery (such as infection or poor wound healing). In rare cases, a cataract may be doing damage to your eye. In these cases, your caregiver may advise surgical removal right away. The vast majority of people who have cataract surgery have better vision afterward. HOME CARE INSTRUCTIONS  If you are not planning surgery, you may be asked to do the following:  Use different eyeglasses.   Use stronger or brighter lighting.   Ask your eye doctor about  reducing your medicine dose or changing medicines if it is thought that a medicine caused your cataract. Changing medicines does not make the cataract go away on its own.   Become familiar with your surroundings. Poor vision can lead to injury. Avoid bumping into things on the affected side. You are at a higher risk for tripping or falling.   Exercise extreme care when driving or operating machinery.   Wear sunglasses if you are sensitive to bright light or experiencing problems with glare.  SEEK IMMEDIATE MEDICAL CARE  IF:   You have a worsening or sudden vision loss.   You notice redness, swelling, or increasing pain in the eye.   You have a fever.  Document Released: 01/21/2005 Document Revised: 01/10/2011 Document Reviewed: 09/14/2010 [facility] Patient Information 2012 [place].PATIENT INSTRUCTIONS POST-ANESTHESIA  IMMEDIATELY FOLLOWING SURGERY:  Do not drive or operate machinery for the first twenty four hours after surgery.  Do not make any important decisions for twenty four hours after surgery or while taking narcotic pain medications or sedatives.  If you develop intractable nausea and vomiting or a severe headache please notify your doctor immediately.  FOLLOW-UP:  Please make an appointment with your surgeon as instructed. You do not need to follow up with anesthesia unless specifically instructed to do so.  WOUND CARE INSTRUCTIONS (if applicable):  Keep a dry clean dressing on the anesthesia/puncture wound site if there is drainage.  Once the wound has quit draining you may leave it open to air.  Generally you should leave the bandage intact for twenty four hours unless there is drainage.  If the epidural site drains for more than 36-48 hours please call the anesthesia department.  QUESTIONS?:  Please feel free to call your physician or the hospital operator if you have any questions, and they will be happy to assist you.

## 2016-10-09 ENCOUNTER — Encounter (HOSPITAL_COMMUNITY)
Admission: RE | Admit: 2016-10-09 | Discharge: 2016-10-09 | Disposition: A | Discharge status: Home / Self Care | Payer: PPO | Source: Ambulatory Visit | Attending: Ophthalmology | Admitting: Ophthalmology

## 2016-10-09 ENCOUNTER — Encounter (HOSPITAL_COMMUNITY): Payer: Self-pay

## 2016-10-09 DIAGNOSIS — H269 Unspecified cataract: Secondary | ICD-10-CM | POA: Insufficient documentation

## 2016-10-09 DIAGNOSIS — Z01812 Encounter for preprocedural laboratory examination: Secondary | ICD-10-CM | POA: Diagnosis not present

## 2016-10-09 HISTORY — DX: Depression, unspecified: F32.A

## 2016-10-09 HISTORY — DX: Hypothyroidism, unspecified: E03.9

## 2016-10-09 HISTORY — DX: Cerebral infarction, unspecified: I63.9

## 2016-10-09 HISTORY — DX: Cardiac arrhythmia, unspecified: I49.9

## 2016-10-09 HISTORY — DX: Rheumatic fever without heart involvement: I00

## 2016-10-09 HISTORY — DX: Anxiety disorder, unspecified: F41.9

## 2016-10-09 HISTORY — DX: Major depressive disorder, single episode, unspecified: F32.9

## 2016-10-09 HISTORY — DX: Essential (primary) hypertension: I10

## 2016-10-09 LAB — BASIC METABOLIC PANEL
ANION GAP: 8 (ref 5–15)
BUN: 19 mg/dL (ref 6–20)
CHLORIDE: 107 mmol/L (ref 101–111)
CO2: 26 mmol/L (ref 22–32)
Calcium: 9.2 mg/dL (ref 8.9–10.3)
Creatinine, Ser: 0.64 mg/dL (ref 0.44–1.00)
GFR calc Af Amer: >60 mL/min (ref >60)
Glucose, Bld: 108 mg/dL — ABNORMAL HIGH (ref 65–99)
POTASSIUM: 3.8 mmol/L (ref 3.5–5.1)
SODIUM: 141 mmol/L (ref 135–145)

## 2016-10-09 LAB — CBC WITH DIFFERENTIAL/PLATELET
BASOS ABS: 0.1 10*3/uL (ref 0.0–0.1)
Basophils Relative: 1 %
Eosinophils Absolute: 0.1 10*3/uL (ref 0.0–0.7)
Eosinophils Relative: 1 %
HCT: 37 % (ref 36.0–46.0)
HEMOGLOBIN: 12.7 g/dL (ref 12.0–15.0)
LYMPHS ABS: 3.5 10*3/uL (ref 0.7–4.0)
LYMPHS PCT: 50 %
MCH: 30.5 pg (ref 26.0–34.0)
MCHC: 34.3 g/dL (ref 30.0–36.0)
MCV: 88.7 fL (ref 78.0–100.0)
Monocytes Absolute: 0.7 10*3/uL (ref 0.1–1.0)
Monocytes Relative: 10 %
NEUTROS ABS: 2.6 10*3/uL (ref 1.7–7.7)
NEUTROS PCT: 38 %
PLATELETS: 207 10*3/uL (ref 150–400)
RBC: 4.17 MIL/uL (ref 3.87–5.11)
RDW: 12.4 % (ref 11.5–15.5)
WBC: 7 10*3/uL (ref 4.0–10.5)

## 2016-10-15 ENCOUNTER — Ambulatory Visit (HOSPITAL_COMMUNITY): Payer: PPO | Admitting: Anesthesiology

## 2016-10-15 ENCOUNTER — Ambulatory Visit (HOSPITAL_COMMUNITY)
Admission: RE | Admit: 2016-10-15 | Discharge: 2016-10-15 | Disposition: A | Discharge status: Home / Self Care | Payer: PPO | Source: Ambulatory Visit | Attending: Ophthalmology | Admitting: Ophthalmology

## 2016-10-15 ENCOUNTER — Encounter (HOSPITAL_COMMUNITY): Payer: Self-pay | Admitting: *Deleted

## 2016-10-15 ENCOUNTER — Encounter (HOSPITAL_COMMUNITY)
Admission: RE | Disposition: A | Discharge status: Home / Self Care | Payer: Self-pay | Source: Ambulatory Visit | Attending: Ophthalmology

## 2016-10-15 DIAGNOSIS — Z79899 Other long term (current) drug therapy: Secondary | ICD-10-CM | POA: Insufficient documentation

## 2016-10-15 DIAGNOSIS — I2510 Atherosclerotic heart disease of native coronary artery without angina pectoris: Secondary | ICD-10-CM | POA: Diagnosis not present

## 2016-10-15 DIAGNOSIS — M797 Fibromyalgia: Secondary | ICD-10-CM | POA: Diagnosis not present

## 2016-10-15 DIAGNOSIS — Z87891 Personal history of nicotine dependence: Secondary | ICD-10-CM | POA: Diagnosis not present

## 2016-10-15 DIAGNOSIS — H269 Unspecified cataract: Secondary | ICD-10-CM | POA: Insufficient documentation

## 2016-10-15 DIAGNOSIS — F418 Other specified anxiety disorders: Secondary | ICD-10-CM | POA: Diagnosis not present

## 2016-10-15 DIAGNOSIS — E039 Hypothyroidism, unspecified: Secondary | ICD-10-CM | POA: Diagnosis not present

## 2016-10-15 DIAGNOSIS — I10 Essential (primary) hypertension: Secondary | ICD-10-CM | POA: Diagnosis not present

## 2016-10-15 DIAGNOSIS — Z8673 Personal history of transient ischemic attack (TIA), and cerebral infarction without residual deficits: Secondary | ICD-10-CM | POA: Diagnosis not present

## 2016-10-15 DIAGNOSIS — H2512 Age-related nuclear cataract, left eye: Secondary | ICD-10-CM | POA: Diagnosis not present

## 2016-10-15 DIAGNOSIS — M1990 Unspecified osteoarthritis, unspecified site: Secondary | ICD-10-CM | POA: Insufficient documentation

## 2016-10-15 DIAGNOSIS — H2511 Age-related nuclear cataract, right eye: Secondary | ICD-10-CM | POA: Diagnosis not present

## 2016-10-15 DIAGNOSIS — E119 Type 2 diabetes mellitus without complications: Secondary | ICD-10-CM | POA: Diagnosis not present

## 2016-10-15 HISTORY — PX: CATARACT EXTRACTION W/PHACO: SHX586

## 2016-10-15 SURGERY — PHACOEMULSIFICATION, CATARACT, WITH IOL INSERTION
Anesthesia: Monitor Anesthesia Care | Laterality: Right

## 2016-10-15 MED ORDER — MIDAZOLAM HCL 2 MG/2ML IJ SOLN
INTRAMUSCULAR | Status: AC
  Filled 2016-10-15: qty 2

## 2016-10-15 MED ORDER — TETRACAINE 0.5 % OP SOLN OPTIME - NO CHARGE
OPHTHALMIC | Status: DC | PRN
  Administered 2016-10-15: 2 [drp] via OPHTHALMIC

## 2016-10-15 MED ORDER — MIDAZOLAM HCL 2 MG/2ML IJ SOLN
1.0000 mg | INTRAMUSCULAR | Status: AC
  Administered 2016-10-15: 2 mg via INTRAVENOUS

## 2016-10-15 MED ORDER — LACTATED RINGERS IV SOLN
INTRAVENOUS | Status: DC
  Administered 2016-10-15: 07:00:00 via INTRAVENOUS

## 2016-10-15 MED ORDER — FENTANYL CITRATE (PF) 100 MCG/2ML IJ SOLN
INTRAMUSCULAR | Status: AC
  Filled 2016-10-15: qty 2

## 2016-10-15 MED ORDER — KETOROLAC TROMETHAMINE 0.5 % OP SOLN
1.0000 [drp] | OPHTHALMIC | Status: AC
  Administered 2016-10-15 (×3): 1 [drp] via OPHTHALMIC

## 2016-10-15 MED ORDER — BSS IO SOLN
INTRAOCULAR | Status: DC | PRN
  Administered 2016-10-15: 500 mL

## 2016-10-15 MED ORDER — PHENYLEPHRINE HCL 2.5 % OP SOLN
1.0000 [drp] | OPHTHALMIC | Status: AC
  Administered 2016-10-15 (×3): 1 [drp] via OPHTHALMIC

## 2016-10-15 MED ORDER — BSS IO SOLN
INTRAOCULAR | Status: DC | PRN
  Administered 2016-10-15: 15 mL via INTRAOCULAR

## 2016-10-15 MED ORDER — FENTANYL CITRATE (PF) 100 MCG/2ML IJ SOLN
25.0000 ug | Freq: Once | INTRAMUSCULAR | Status: AC
  Administered 2016-10-15: 25 ug via INTRAVENOUS

## 2016-10-15 MED ORDER — EPINEPHRINE PF 1 MG/ML IJ SOLN
INTRAMUSCULAR | Status: AC
  Filled 2016-10-15: qty 1

## 2016-10-15 MED ORDER — MIDAZOLAM HCL 2 MG/2ML IJ SOLN
INTRAMUSCULAR | Status: DC | PRN
  Administered 2016-10-15 (×2): 1 mg via INTRAVENOUS

## 2016-10-15 MED ORDER — TETRACAINE HCL 0.5 % OP SOLN
1.0000 [drp] | OPHTHALMIC | Status: AC
  Administered 2016-10-15 (×3): 1 [drp] via OPHTHALMIC

## 2016-10-15 MED ORDER — PROVISC 10 MG/ML IO SOLN
INTRAOCULAR | Status: DC | PRN
  Administered 2016-10-15: 0.85 mL via INTRAOCULAR

## 2016-10-15 MED ORDER — CYCLOPENTOLATE-PHENYLEPHRINE 0.2-1 % OP SOLN
1.0000 [drp] | OPHTHALMIC | Status: AC
  Administered 2016-10-15 (×3): 1 [drp] via OPHTHALMIC

## 2016-10-15 SURGICAL SUPPLY — 16 items
CAPSULAR TENSION RING-AMO (OPHTHALMIC RELATED) IMPLANT
CLOTH BEACON ORANGE TIMEOUT ST (SAFETY) ×2 IMPLANT
EYE SHIELD UNIVERSAL CLEAR (GAUZE/BANDAGES/DRESSINGS) ×2 IMPLANT
GLOVE BIOGEL PI IND STRL 7.0 (GLOVE) IMPLANT
GLOVE BIOGEL PI IND STRL 7.5 (GLOVE) IMPLANT
GLOVE BIOGEL PI INDICATOR 7.0 (GLOVE) ×2
GLOVE BIOGEL PI INDICATOR 7.5 (GLOVE) ×2
HEALON 5 0.6 ML (INTRAOCULAR LENS) IMPLANT
LENS ALC ACRYL/TECN (Ophthalmic Related) ×3 IMPLANT
PAD ARMBOARD 7.5X6 YLW CONV (MISCELLANEOUS) ×2 IMPLANT
RETRACTOR IRIS SIGHTPATH (OPHTHALMIC RELATED) IMPLANT
RING MALYGIN (MISCELLANEOUS) ×2 IMPLANT
TAPE SURG TRANSPORE 1 IN (GAUZE/BANDAGES/DRESSINGS) IMPLANT
TAPE SURGICAL TRANSPORE 1 IN (GAUZE/BANDAGES/DRESSINGS) ×2
VISCOELASTIC ADDITIONAL (OPHTHALMIC RELATED) IMPLANT
WATER STERILE IRR 250ML POUR (IV SOLUTION) ×2 IMPLANT

## 2016-10-15 NOTE — Anesthesia Preprocedure Evaluation (Signed)
Anesthesia Evaluation  Patient identified by MRN, date of birth, ID band Patient awake    Reviewed: Allergy & Precautions, NPO status , Patient's Chart, lab work & pertinent test results, reviewed documented beta blocker date and time   Airway Mallampati: II  TM Distance: >3 FB Neck ROM: Full    Dental  (+) Edentulous Upper   Pulmonary former smoker,    breath sounds clear to auscultation       Cardiovascular hypertension, Pt. on home beta blockers and Pt. on medications + CAD  + dysrhythmias  Rhythm:Regular Rate:Bradycardia     Neuro/Psych PSYCHIATRIC DISORDERS Anxiety Depression CVA    GI/Hepatic   Endo/Other  diabetes, Type 2Hypothyroidism   Renal/GU      Musculoskeletal  (+) Fibromyalgia -  Abdominal   Peds  Hematology   Anesthesia Other Findings   Reproductive/Obstetrics                             Anesthesia Physical Anesthesia Plan  ASA: III  Anesthesia Plan: MAC   Post-op Pain Management:    Induction: Intravenous  PONV Risk Score and Plan:   Airway Management Planned: Nasal Cannula  Additional Equipment:   Intra-op Plan:   Post-operative Plan:   Informed Consent: I have reviewed the patients History and Physical, chart, labs and discussed the procedure including the risks, benefits and alternatives for the proposed anesthesia with the patient or authorized representative who has indicated his/her understanding and acceptance.     Plan Discussed with:   Anesthesia Plan Comments:         Anesthesia Quick Evaluation  

## 2016-10-15 NOTE — H&P (Signed)
The patient was re examined and there is no change in the patients condition since the original H and P. 

## 2016-10-15 NOTE — Op Note (Signed)
Patient brought to the operating room and prepped and draped in the usual manner.  Lid speculum inserted in right eye.  Stab incision made at the twelve o'clock position.  Provisc instilled in the anterior chamber.   A 2.4 mm. Stab incision was made temporally. Due to a small pupil, a Malugyn Ring was inserted.  An anterior capsulotomy was done with a bent 25 gauge needle.  The nucleus was hydrodissected.  The Phaco tip was inserted in the anterior chamber and the nucleus was emulsified.  CDE was 7.56.  The cortical material was then removed with the I and A tip.  Posterior capsule was the polished.  The anterior chamber was deepened with Provisc.  A 22.0 [place] was then inserted in the capsular bag.  The Malugyn Ring was removes.  Provisc was then removed with the I and A tip.  The wound was then hydrated.  Patient sent to the Recovery Room in good condition with follow up in my office.  Preoperative Diagnosis:  Nuclear Cataract OD Postoperative Diagnosis:  Same Procedure name: Kelman Phacoemulsification OD with IOL

## 2016-10-15 NOTE — Discharge Instructions (Signed)
[facility] Instructions [place] [address]      1. Avoid closing eyes tightly. One often closes the eye tightly when laughing, talking, sneezing, coughing or if they feel irritated. At these times, you should be careful not to close your eyes tightly.  2. Instill eye drops as instructed. To instill drops in your eye, open it, look up and have someone gently pull the lower lid down and instill a couple of drops inside the lower lid.  3. Do not touch upper lid.  4. Take Advil or Tylenol for pain.  5. You may use either eye for near work, such as reading or sewing and you may watch television.  6. You may have your hair done at the beauty parlor at any time.  7. Wear dark glasses with or without your own glasses if you are in bright light.  8. Call our office at [phone] or [phone] if you have sharp pain in your eye or unusual symptoms.  9.  FOLLOW UP WITH DR. SHAPIRO TODAY IN HIS [place] OFFICE AT 3:00pm.    I have received a copy of the above instructions and will follow them.     IF YOU ARE IN IMMEDIATE DANGER CALL 911!  It is important for you to keep your follow-up appointment with your physician after discharge, OR, for you /your caregiver to make a follow-up appointment with your physician / medical provider after discharge.  Show these instructions to the next healthcare provider you see.   Moderate Conscious Sedation, Adult, Care After These instructions provide you with information about caring for yourself after your procedure. Your health care provider may also give you more specific instructions. Your treatment has been planned according to current medical practices, but problems sometimes occur. Call your health care provider if you have any problems or questions after your procedure. What can I expect after the procedure? After your procedure, it is common:  To feel sleepy for several  hours.  To feel clumsy and have poor balance for several hours.  To have poor judgment for several hours.  To vomit if you eat too soon.  Follow these instructions at home: For at least 24 hours after the procedure:   Do not: ? Participate in activities where you could fall or become injured. ? Drive. ? Use heavy machinery. ? Drink alcohol. ? Take sleeping pills or medicines that cause drowsiness. ? Make important decisions or sign legal documents. ? Take care of children on your own.  Rest. Eating and drinking  Follow the diet recommended by your health care provider.  If you vomit: ? Drink water, juice, or soup when you can drink without vomiting. ? Make sure you have little or no nausea before eating solid foods. General instructions  Have a responsible adult stay with you until you are awake and alert.  Take over-the-counter and prescription medicines only as told by your health care provider.  If you smoke, do not smoke without supervision.  Keep all follow-up visits as told by your health care provider. This is important. Contact a health care provider if:  You keep feeling nauseous or you keep vomiting.  You feel light-headed.  You develop a rash.  You have a fever. Get help right away if:  You have trouble breathing. This information is not intended to replace advice given to you by your health care provider. Make sure you  discuss any questions you have with your health care provider. Document Released: 11/11/2012 Document Revised: 06/26/2015 Document Reviewed: 05/13/2015 Elsevier Interactive Patient Education  [employer].

## 2016-10-15 NOTE — Anesthesia Postprocedure Evaluation (Signed)
Anesthesia Post Note  Patient: Addisynn Vassell [place]  Procedure(s) Performed: Procedure(s) (LRB): CATARACT EXTRACTION PHACO AND INTRAOCULAR LENS PLACEMENT (IOC) (Right)  Patient location during evaluation: PACU Anesthesia Type: MAC Level of consciousness: awake, awake and alert, oriented and patient cooperative Pain management: pain level controlled Vital Signs Assessment: post-procedure vital signs reviewed and stable Respiratory status: spontaneous breathing, nonlabored ventilation and respiratory function stable Cardiovascular status: stable Postop Assessment: no signs of nausea or vomiting Anesthetic complications: no     Last Vitals:  Vitals:   10/15/16 0715 10/15/16 0730  BP: (!) 126/50 (!) 119/53  Resp: 14 (!) 0  Temp:    SpO2: 96% 98%    Last Pain:  Vitals:   10/15/16 0707  TempSrc: Oral                 Toshiyuki Fredell L

## 2016-10-15 NOTE — Transfer of Care (Signed)
Immediate Anesthesia Transfer of Care Note  Patient: Coletta Lockner [place]  Procedure(s) Performed: Procedure(s) with comments: CATARACT EXTRACTION PHACO AND INTRAOCULAR LENS PLACEMENT (IOC) (Right) - CDE: 7.56  Patient Location: PACU  Anesthesia Type:MAC  Level of Consciousness: awake, alert , oriented and patient cooperative  Airway & Oxygen Therapy: Patient Spontanous Breathing  Post-op Assessment: Report given to RN and Post -op Vital signs reviewed and stable  Post vital signs: Reviewed and stable  Last Vitals:  Vitals:   10/15/16 0715 10/15/16 0730  BP: (!) 126/50 (!) 119/53  Resp: 14 (!) 0  Temp:    SpO2: 96% 98%    Last Pain:  Vitals:   10/15/16 0707  TempSrc: Oral      Patients Stated Pain Goal: 7 (99/83/38 2505)  Complications: No apparent anesthesia complications

## 2016-10-16 ENCOUNTER — Encounter (HOSPITAL_COMMUNITY): Payer: Self-pay | Admitting: Ophthalmology

## 2016-10-17 ENCOUNTER — Encounter: Payer: Self-pay | Admitting: *Deleted

## 2016-10-17 ENCOUNTER — Ambulatory Visit (INDEPENDENT_AMBULATORY_CARE_PROVIDER_SITE_OTHER): Payer: PPO | Admitting: Cardiology

## 2016-10-17 ENCOUNTER — Encounter: Payer: Self-pay | Admitting: Cardiology

## 2016-10-17 VITALS — BP 130/60 | HR 50 | Ht 63.0 in | Wt 134.0 lb

## 2016-10-17 DIAGNOSIS — R002 Palpitations: Secondary | ICD-10-CM

## 2016-10-17 NOTE — Patient Instructions (Signed)

## 2016-10-17 NOTE — Progress Notes (Signed)
Clinical Summary Tina Schultz is a 71 y.o.female seen today for follow up of the following medical problems.   1. Palpitations - seen in ER 03/28/16 with palpitations - symptoms started 8 weeks ago. Feeling of heart pounding, can feel dizzy. Occurs at rest or with exertion. Can get get diaphoretic. No SOB no chest pain. Lasts just a few seconds. Occurs several times a day. Can awake from sleep. No specific trigger - coffee x 2cups, no tea, no sodas, no energy drinks, no EtOH. Drinks 1 bottle of water. - she reports atenolol was decreased to 25 mg daily, orthostatic in clinic. During recent ER visit increased back to 31m daily. Symptopms have not imrpoved - TSH 1.167, K 3.3, Cr 0.63  - 04/2016 normal heart monitor  - mld infrequent palpitations since last visit.   2. CAD? - CAD is mentioned in her PMH. She unclear of this diagnosis - [facility] she reports she was told to be normal - no recent chest pain.    SH: her husband is Richard [employer] also a patient of mine.  Past Medical History:  Diagnosis Date  . Anxiety   . Coronary artery disease   . Depression   . Diabetes mellitus [place] complication [place]   . Dysrhythmia   . Fibromyalgia   . Hypertension   . Hypothyroidism   . IBS (irritable bowel syndrome)   . Rheumatic fever   . Stroke ([facility] [place] . Thyroid disease      Allergies  Allergen Reactions  . Contrast Media [Iodinated Diagnostic Agents] Nausea And Vomiting and Other (See Comments)    Increased heart rate and sweating "a long time ago."  01/25/14 pt states she "went out" after receiving iv contrast 25 yrs ago and [facility]  . Aspirin Other (See Comments)    Bleeding precautions secondary to ulcerative colitis.  . Codeine Nausea And Vomiting  . Morphine And Related Nausea And Vomiting  . Sulfa Antibiotics Nausea And Vomiting     Current Outpatient  Prescriptions  Medication Sig Dispense Refill  . atenolol (TENORMIN) 25 MG tablet Take 1 tablet (25 mg total) by mouth 2 (two) times daily. (Patient taking differently: Take 25-50 mg by mouth 2 (two) times daily. Take 50 mg in the morning and 25 mg in the evening.) 180 tablet 1  . diphenhydramine-acetaminophen (TYLENOL PM) 25-500 MG TABS tablet Take 1 tablet by mouth at bedtime as needed (sleep).     .Marland Kitchenestradiol (ESTRACE) 0.5 MG tablet Take 0.5 mg by mouth daily.    .Marland Kitchengabapentin (NEURONTIN) 600 MG tablet Take 600 mg by mouth 3 (three) times daily.    .Marland Kitchenlatanoprost (XALATAN) 0.005 % ophthalmic solution Place 1 drop into both eyes at bedtime.    .Marland Kitchenlevothyroxine (SYNTHROID, LEVOTHROID) 100 MCG tablet Take 100 mcg by mouth daily before breakfast.    . lisinopril (PRINIVIL,ZESTRIL) 5 MG tablet Take 2.5 mg by mouth daily.    . Multiple Vitamins-Minerals (CENTRUM SILVER ADULT 50+) TABS Take 1 tablet by mouth daily.    .Marland Kitchenomeprazole (PRILOSEC) 20 MG capsule Take 20 mg by mouth daily.     No current facility-administered medications for this visit.      Past Surgical History:  Procedure Laterality Date  . ABDOMINAL HYSTERECTOMY    . CATARACT EXTRACTION W/PHACO Right 10/15/2016   Procedure: CATARACT EXTRACTION PHACO AND INTRAOCULAR  LENS PLACEMENT (IOC);  Surgeon: Rutherford Guys, MD;  Location: [facility];  Service: Ophthalmology;  Laterality: Right;  CDE: 7.56  . CHOLECYSTECTOMY    . ELBOW DEBRIDEMENT    . THYROID SURGERY       Allergies  Allergen Reactions  . Contrast Media [Iodinated Diagnostic Agents] Nausea And Vomiting and Other (See Comments)    Increased heart rate and sweating "a long time ago."  01/25/14 pt states she "went out" after receiving iv contrast 25 yrs ago and was told by [facility] to never have it again  . Aspirin Other (See Comments)    Bleeding precautions secondary to ulcerative colitis.  . Codeine Nausea And Vomiting  . Morphine And Related Nausea And Vomiting  .  Sulfa Antibiotics Nausea And Vomiting      Family History  Problem Relation Age of Onset  . Cancer Mother   . Stroke Father   . Addison's disease Sister   . Alzheimer's disease Brother      Social History Ms. Bearce reports that she has quit smoking. She has never used smokeless tobacco. Ms. Mazzeo reports that she does not drink alcohol.   Review of Systems CONSTITUTIONAL: No weight loss, fever, chills, weakness or fatigue.  HEENT: Eyes: No visual loss, blurred vision, double vision or yellow sclerae.No hearing loss, sneezing, congestion, runny nose or sore throat.  SKIN: No rash or itching.  CARDIOVASCULAR: per hpi RESPIRATORY: No shortness of breath, cough or sputum.  GASTROINTESTINAL: No anorexia, nausea, vomiting or diarrhea. No abdominal pain or blood.  GENITOURINARY: No burning on urination, no polyuria NEUROLOGICAL: No headache, dizziness, syncope, paralysis, ataxia, numbness or tingling in the extremities. No change in bowel or bladder control.  MUSCULOSKELETAL: No muscle, back pain, joint pain or stiffness.  LYMPHATICS: No enlarged nodes. No history of splenectomy.  PSYCHIATRIC: No history of depression or anxiety.  ENDOCRINOLOGIC: No reports of sweating, cold or heat intolerance. No polyuria or polydipsia.  Marland Kitchen   Physical Examination Vitals:   10/17/16 1026  BP: 130/60  Pulse: (!) 50  SpO2: 98%   Vitals:   10/17/16 1026  Weight: 134 lb (60.8 kg)  Height: 5' 3"  (1.6 m)    Gen: resting comfortably, no acute distress HEENT: no scleral icterus, pupils equal round and reactive, no palptable cervical adenopathy,  CV: RRR, no m/r/g, no jvd Resp: Clear to auscultation bilaterally GI: abdomen is soft, non-tender, non-distended, normal bowel sounds, no hepatosplenomegaly MSK: extremities are warm, no edema.  Skin: warm, no rash Neuro:  no focal deficits Psych: appropriate affect   Diagnostic Studies  04/2016 heart monitor  Telemetry tracings show normal  sinus rhythm  No symptoms reported  No significant arrhythmias    Assessment and Plan    1. Palpitations - overall doing well. Previous heart monitor showed no significant arrhythmias - continue beta blocker for symptoms. Asymptomatic bradycardia today, continue to monitor, if has symptoms would lower beta blocker further.   F/u 1 year  Arnoldo Lenis, M.D.

## 2016-10-22 ENCOUNTER — Other Ambulatory Visit (HOSPITAL_COMMUNITY): Payer: PPO

## 2016-10-22 DIAGNOSIS — E039 Hypothyroidism, unspecified: Secondary | ICD-10-CM | POA: Diagnosis not present

## 2016-10-29 ENCOUNTER — Encounter (HOSPITAL_COMMUNITY): Admission: RE | Payer: Self-pay | Source: Ambulatory Visit

## 2016-10-29 ENCOUNTER — Ambulatory Visit (HOSPITAL_COMMUNITY): Admission: RE | Admit: 2016-10-29 | Payer: PPO | Source: Ambulatory Visit | Admitting: Ophthalmology

## 2016-10-29 SURGERY — PHACOEMULSIFICATION, CATARACT, WITH IOL INSERTION
Anesthesia: Monitor Anesthesia Care | Laterality: Left

## 2016-11-22 ENCOUNTER — Encounter (HOSPITAL_COMMUNITY)
Admission: RE | Admit: 2016-11-22 | Discharge: 2016-11-22 | Disposition: A | Discharge status: Home / Self Care | Payer: PPO | Source: Ambulatory Visit | Attending: Ophthalmology | Admitting: Ophthalmology

## 2016-11-26 ENCOUNTER — Encounter (HOSPITAL_COMMUNITY): Admission: RE | Payer: Self-pay | Source: Ambulatory Visit

## 2016-11-26 ENCOUNTER — Ambulatory Visit (HOSPITAL_COMMUNITY): Admission: RE | Admit: 2016-11-26 | Payer: PPO | Source: Ambulatory Visit | Admitting: Ophthalmology

## 2016-11-26 SURGERY — PHACOEMULSIFICATION, CATARACT, WITH IOL INSERTION
Anesthesia: Monitor Anesthesia Care | Laterality: Left

## 2016-12-16 DIAGNOSIS — Z23 Encounter for immunization: Secondary | ICD-10-CM | POA: Diagnosis not present

## 2016-12-30 DIAGNOSIS — J209 Acute bronchitis, unspecified: Secondary | ICD-10-CM | POA: Diagnosis not present

## 2016-12-30 DIAGNOSIS — J0101 Acute recurrent maxillary sinusitis: Secondary | ICD-10-CM | POA: Diagnosis not present

## 2016-12-30 DIAGNOSIS — Z6823 Body mass index (BMI) 23.0-23.9, adult: Secondary | ICD-10-CM | POA: Diagnosis not present

## 2017-01-07 ENCOUNTER — Encounter (HOSPITAL_COMMUNITY)
Admission: RE | Admit: 2017-01-07 | Discharge: 2017-01-07 | Disposition: A | Discharge status: Home / Self Care | Payer: PPO | Source: Ambulatory Visit | Attending: Ophthalmology | Admitting: Ophthalmology

## 2017-01-07 ENCOUNTER — Encounter (HOSPITAL_COMMUNITY): Payer: Self-pay

## 2017-01-07 ENCOUNTER — Other Ambulatory Visit: Payer: Self-pay

## 2017-01-07 DIAGNOSIS — H269 Unspecified cataract: Secondary | ICD-10-CM | POA: Diagnosis not present

## 2017-01-07 DIAGNOSIS — Z01818 Encounter for other preprocedural examination: Secondary | ICD-10-CM | POA: Diagnosis not present

## 2017-01-07 DIAGNOSIS — R001 Bradycardia, unspecified: Secondary | ICD-10-CM | POA: Insufficient documentation

## 2017-01-07 HISTORY — DX: Unspecified dementia, unspecified severity, without behavioral disturbance, psychotic disturbance, mood disturbance, and anxiety: F03.90

## 2017-01-07 HISTORY — DX: Gastro-esophageal reflux disease without esophagitis: K21.9

## 2017-01-07 HISTORY — DX: Unspecified glaucoma: H40.9

## 2017-01-07 HISTORY — DX: Unspecified asthma, uncomplicated: J45.909

## 2017-01-08 DIAGNOSIS — Z6823 Body mass index (BMI) 23.0-23.9, adult: Secondary | ICD-10-CM | POA: Diagnosis not present

## 2017-01-08 DIAGNOSIS — J209 Acute bronchitis, unspecified: Secondary | ICD-10-CM | POA: Diagnosis not present

## 2017-01-08 DIAGNOSIS — J0101 Acute recurrent maxillary sinusitis: Secondary | ICD-10-CM | POA: Diagnosis not present

## 2017-01-09 DIAGNOSIS — I10 Essential (primary) hypertension: Secondary | ICD-10-CM | POA: Diagnosis not present

## 2017-01-09 DIAGNOSIS — E1165 Type 2 diabetes mellitus with hyperglycemia: Secondary | ICD-10-CM | POA: Diagnosis not present

## 2017-01-09 DIAGNOSIS — K219 Gastro-esophageal reflux disease without esophagitis: Secondary | ICD-10-CM | POA: Diagnosis not present

## 2017-01-09 DIAGNOSIS — F331 Major depressive disorder, recurrent, moderate: Secondary | ICD-10-CM | POA: Diagnosis not present

## 2017-01-09 DIAGNOSIS — R42 Dizziness and giddiness: Secondary | ICD-10-CM | POA: Diagnosis not present

## 2017-01-09 DIAGNOSIS — E039 Hypothyroidism, unspecified: Secondary | ICD-10-CM | POA: Diagnosis not present

## 2017-01-09 DIAGNOSIS — E8881 Metabolic syndrome: Secondary | ICD-10-CM | POA: Diagnosis not present

## 2017-01-09 DIAGNOSIS — Z9189 Other specified personal risk factors, not elsewhere classified: Secondary | ICD-10-CM | POA: Diagnosis not present

## 2017-01-09 DIAGNOSIS — M797 Fibromyalgia: Secondary | ICD-10-CM | POA: Diagnosis not present

## 2017-01-14 ENCOUNTER — Encounter (HOSPITAL_COMMUNITY)
Admission: RE | Disposition: A | Discharge status: Home / Self Care | Payer: Self-pay | Source: Ambulatory Visit | Attending: Ophthalmology

## 2017-01-14 ENCOUNTER — Ambulatory Visit (HOSPITAL_COMMUNITY): Payer: PPO | Admitting: Anesthesiology

## 2017-01-14 ENCOUNTER — Encounter (HOSPITAL_COMMUNITY): Payer: Self-pay

## 2017-01-14 ENCOUNTER — Ambulatory Visit (HOSPITAL_COMMUNITY)
Admission: RE | Admit: 2017-01-14 | Discharge: 2017-01-14 | Disposition: A | Discharge status: Home / Self Care | Payer: PPO | Source: Ambulatory Visit | Attending: Ophthalmology | Admitting: Ophthalmology

## 2017-01-14 DIAGNOSIS — M797 Fibromyalgia: Secondary | ICD-10-CM | POA: Insufficient documentation

## 2017-01-14 DIAGNOSIS — I2510 Atherosclerotic heart disease of native coronary artery without angina pectoris: Secondary | ICD-10-CM | POA: Diagnosis not present

## 2017-01-14 DIAGNOSIS — Z79899 Other long term (current) drug therapy: Secondary | ICD-10-CM | POA: Diagnosis not present

## 2017-01-14 DIAGNOSIS — Z9841 Cataract extraction status, right eye: Secondary | ICD-10-CM | POA: Insufficient documentation

## 2017-01-14 DIAGNOSIS — H2512 Age-related nuclear cataract, left eye: Secondary | ICD-10-CM | POA: Insufficient documentation

## 2017-01-14 DIAGNOSIS — F419 Anxiety disorder, unspecified: Secondary | ICD-10-CM | POA: Insufficient documentation

## 2017-01-14 DIAGNOSIS — Z87891 Personal history of nicotine dependence: Secondary | ICD-10-CM | POA: Diagnosis not present

## 2017-01-14 DIAGNOSIS — F329 Major depressive disorder, single episode, unspecified: Secondary | ICD-10-CM | POA: Diagnosis not present

## 2017-01-14 DIAGNOSIS — Z8673 Personal history of transient ischemic attack (TIA), and cerebral infarction without residual deficits: Secondary | ICD-10-CM | POA: Diagnosis not present

## 2017-01-14 DIAGNOSIS — Z886 Allergy status to analgesic agent status: Secondary | ICD-10-CM | POA: Diagnosis not present

## 2017-01-14 DIAGNOSIS — M1990 Unspecified osteoarthritis, unspecified site: Secondary | ICD-10-CM | POA: Diagnosis not present

## 2017-01-14 DIAGNOSIS — E1136 Type 2 diabetes mellitus with diabetic cataract: Secondary | ICD-10-CM | POA: Diagnosis not present

## 2017-01-14 DIAGNOSIS — Z961 Presence of intraocular lens: Secondary | ICD-10-CM | POA: Diagnosis not present

## 2017-01-14 DIAGNOSIS — H409 Unspecified glaucoma: Secondary | ICD-10-CM | POA: Diagnosis not present

## 2017-01-14 DIAGNOSIS — E039 Hypothyroidism, unspecified: Secondary | ICD-10-CM | POA: Insufficient documentation

## 2017-01-14 DIAGNOSIS — Z885 Allergy status to narcotic agent status: Secondary | ICD-10-CM | POA: Insufficient documentation

## 2017-01-14 DIAGNOSIS — I10 Essential (primary) hypertension: Secondary | ICD-10-CM | POA: Diagnosis not present

## 2017-01-14 DIAGNOSIS — Z9889 Other specified postprocedural states: Secondary | ICD-10-CM | POA: Diagnosis not present

## 2017-01-14 DIAGNOSIS — Z79890 Hormone replacement therapy (postmenopausal): Secondary | ICD-10-CM | POA: Diagnosis not present

## 2017-01-14 HISTORY — PX: CATARACT EXTRACTION W/PHACO: SHX586

## 2017-01-14 LAB — GLUCOSE, CAPILLARY: Glucose-Capillary: 93 mg/dL (ref 65–99)

## 2017-01-14 SURGERY — PHACOEMULSIFICATION, CATARACT, WITH IOL INSERTION
Anesthesia: Monitor Anesthesia Care | Site: Eye | Laterality: Left

## 2017-01-14 MED ORDER — EPINEPHRINE PF 1 MG/ML IJ SOLN
INTRAMUSCULAR | Status: DC | PRN
  Administered 2017-01-14: 500 mL

## 2017-01-14 MED ORDER — LACTATED RINGERS IV SOLN
INTRAVENOUS | Status: DC
  Administered 2017-01-14: 09:00:00 via INTRAVENOUS

## 2017-01-14 MED ORDER — PROVISC 10 MG/ML IO SOLN
INTRAOCULAR | Status: DC | PRN
  Administered 2017-01-14: 0.85 mL via INTRAOCULAR

## 2017-01-14 MED ORDER — KETOROLAC TROMETHAMINE 0.5 % OP SOLN
1.0000 [drp] | OPHTHALMIC | Status: AC
  Administered 2017-01-14 (×3): 1 [drp] via OPHTHALMIC

## 2017-01-14 MED ORDER — BSS IO SOLN
INTRAOCULAR | Status: DC | PRN
  Administered 2017-01-14: 15 mL

## 2017-01-14 MED ORDER — CYCLOPENTOLATE-PHENYLEPHRINE 0.2-1 % OP SOLN
1.0000 [drp] | OPHTHALMIC | Status: AC
  Administered 2017-01-14 (×3): 1 [drp] via OPHTHALMIC

## 2017-01-14 MED ORDER — FENTANYL CITRATE (PF) 100 MCG/2ML IJ SOLN
25.0000 ug | Freq: Once | INTRAMUSCULAR | Status: AC
  Administered 2017-01-14: 25 ug via INTRAVENOUS
  Filled 2017-01-14: qty 2

## 2017-01-14 MED ORDER — TETRACAINE 0.5 % OP SOLN OPTIME - NO CHARGE
OPHTHALMIC | Status: DC | PRN
  Administered 2017-01-14: 2 [drp] via OPHTHALMIC

## 2017-01-14 MED ORDER — TETRACAINE HCL 0.5 % OP SOLN
1.0000 [drp] | OPHTHALMIC | Status: AC
  Administered 2017-01-14 (×3): 1 [drp] via OPHTHALMIC

## 2017-01-14 MED ORDER — PHENYLEPHRINE HCL 2.5 % OP SOLN
1.0000 [drp] | OPHTHALMIC | Status: AC
  Administered 2017-01-14 (×3): 1 [drp] via OPHTHALMIC

## 2017-01-14 MED ORDER — MIDAZOLAM HCL 2 MG/2ML IJ SOLN
1.0000 mg | INTRAMUSCULAR | Status: AC
  Administered 2017-01-14: 2 mg via INTRAVENOUS
  Filled 2017-01-14: qty 2

## 2017-01-14 SURGICAL SUPPLY — 10 items
CLOTH BEACON ORANGE TIMEOUT ST (SAFETY) ×2 IMPLANT
EYE SHIELD UNIVERSAL CLEAR (GAUZE/BANDAGES/DRESSINGS) ×2 IMPLANT
GLOVE BIOGEL PI IND STRL 7.0 (GLOVE) IMPLANT
GLOVE BIOGEL PI INDICATOR 7.0 (GLOVE) ×4
LENS ALC ACRYL/TECN (Ophthalmic Related) ×3 IMPLANT
PAD ARMBOARD 7.5X6 YLW CONV (MISCELLANEOUS) ×2 IMPLANT
RING MALYGIN (MISCELLANEOUS) ×2 IMPLANT
TAPE SURG TRANSPORE 1 IN (GAUZE/BANDAGES/DRESSINGS) IMPLANT
TAPE SURGICAL TRANSPORE 1 IN (GAUZE/BANDAGES/DRESSINGS) ×2
WATER STERILE IRR 250ML POUR (IV SOLUTION) ×2 IMPLANT

## 2017-01-14 NOTE — Anesthesia Postprocedure Evaluation (Signed)
Anesthesia Post Note  Patient: Ruby Logiudice Jurgens  Procedure(s) Performed: CATARACT EXTRACTION PHACO AND INTRAOCULAR LENS PLACEMENT (IOC) (Left Eye)  Patient location during evaluation: PACU Anesthesia Type: MAC Level of consciousness: awake and alert Pain management: satisfactory to patient Vital Signs Assessment: post-procedure vital signs reviewed and stable Respiratory status: spontaneous breathing Cardiovascular status: stable Postop Assessment: no apparent nausea or vomiting Anesthetic complications: no     Last Vitals:  Vitals:   01/14/17 0855 01/14/17 0900  BP: (!) 131/57 (!) 136/51  Pulse:    Resp: 20 14  Temp:    SpO2: 96% 94%    Last Pain:  Vitals:   01/14/17 0804  TempSrc: Oral                 Issiah Huffaker

## 2017-01-14 NOTE — Transfer of Care (Signed)
Immediate Anesthesia Transfer of Care Note  Patient: Tina Schultz  Procedure(s) Performed: CATARACT EXTRACTION PHACO AND INTRAOCULAR LENS PLACEMENT (IOC) (Left Eye)  Patient Location: Short Stay  Anesthesia Type:MAC  Level of Consciousness: awake and alert   Airway & Oxygen Therapy: Patient Spontanous Breathing  Post-op Assessment: Report given to RN and Post -op Vital signs reviewed and stable  Post vital signs: Reviewed and stable  Last Vitals:  Vitals:   01/14/17 0855 01/14/17 0900  BP: (!) 131/57 (!) 136/51  Pulse:    Resp: 20 14  Temp:    SpO2: 96% 94%    Last Pain:  Vitals:   01/14/17 0804  TempSrc: Oral         Complications: No apparent anesthesia complications

## 2017-01-14 NOTE — H&P (Signed)
The patient was re examined and there is no change in the patients condition since the original H and P. 

## 2017-01-14 NOTE — Op Note (Signed)
Patient brought to the operating room and prepped and draped in the usual manner.  Lid speculum inserted in left eye.  Stab incision made at the twelve o'clock position.  Provisc instilled in the anterior chamber.   A 2.4 mm. Stab incision was made temporally. Due to a small pupil, a Malugyn Ring was inserted. An anterior capsulotomy was done with a bent 25 gauge needle.  The nucleus was hydrodissected.  The Phaco tip was inserted in the anterior chamber and the nucleus was emulsified.  CDE was 6.28.  The cortical material was then removed with the I and A tip.  Posterior capsule was the polished.  The anterior chamber was deepened with Provisc.  A 21.0 Diopter Alcon AU00T0 IOL was then inserted in the capsular bag.  The Malugyn Ring was removed. Provisc was then removed with the I and A tip.  The wound was then hydrated.  Patient sent to the Recovery Room in good condition with follow up in my office.  Preoperative Diagnosis:  Nuclear Cataract OS Postoperative Diagnosis:  Same Procedure name: Kelman Phacoemulsification OS with IOL

## 2017-01-14 NOTE — Anesthesia Preprocedure Evaluation (Addendum)
Anesthesia Evaluation  Patient identified by MRN, date of birth, ID band Patient awake    Reviewed: Allergy & Precautions, NPO status , Patient's Chart, lab work & pertinent test results, reviewed documented beta blocker date and time   Airway Mallampati: II  TM Distance: >3 FB Neck ROM: Full    Dental  (+) Edentulous Upper   Pulmonary former smoker,    breath sounds clear to auscultation       Cardiovascular hypertension, Pt. on home beta blockers and Pt. on medications + CAD  + dysrhythmias  Rhythm:Regular Rate:Bradycardia     Neuro/Psych PSYCHIATRIC DISORDERS Anxiety Depression CVA    GI/Hepatic   Endo/Other  diabetes, Type 2Hypothyroidism   Renal/GU      Musculoskeletal  (+) Fibromyalgia -  Abdominal   Peds  Hematology   Anesthesia Other Findings   Reproductive/Obstetrics                             Anesthesia Physical Anesthesia Plan  ASA: III  Anesthesia Plan: MAC   Post-op Pain Management:    Induction: Intravenous  PONV Risk Score and Plan:   Airway Management Planned: Nasal Cannula  Additional Equipment:   Intra-op Plan:   Post-operative Plan:   Informed Consent: I have reviewed the patients History and Physical, chart, labs and discussed the procedure including the risks, benefits and alternatives for the proposed anesthesia with the patient or authorized representative who has indicated his/her understanding and acceptance.     Plan Discussed with:   Anesthesia Plan Comments:         Anesthesia Quick Evaluation

## 2017-01-14 NOTE — Discharge Instructions (Signed)
°  °          [facility] Instructions °[address]- [place] [address] °    ° °1. Avoid closing eyes tightly. One often closes the eye tightly when laughing, talking, sneezing, coughing or if they feel irritated. At these times, you should be careful not to close your eyes tightly. ° °2. Instill eye drops as instructed. To instill drops in your eye, open it, look up and have someone gently pull the lower lid down and instill a couple of drops inside the lower lid. ° °3. Do not touch upper lid. ° °4. Take Advil or Tylenol for pain. ° °5. You may use either eye for near work, such as reading or sewing and you may watch television. ° °6. You may have your hair done at the beauty parlor at any time. ° °7. Wear dark glasses with or without your own glasses if you are in bright light. ° °8. Call our office at [phone] or [phone] if you have sharp pain in your eye or unusual symptoms. ° °9.  FOLLOW UP WITH DR. SHAPIRO TODAY IN HIS [place] OFFICE AT 2:45pm. ° °  °I have received a copy of the above instructions and will follow them.  ° ° ° °IF YOU ARE IN IMMEDIATE DANGER CALL 911! ° °It is important for you to keep your follow-up appointment with your physician after discharge, OR, for you /your caregiver to make a follow-up appointment with your physician / medical provider after discharge. ° °Show these instructions to the next healthcare provider you see. ° °

## 2017-01-14 NOTE — Anesthesia Procedure Notes (Signed)
Procedure Name: MAC Date/Time: 01/14/2017 9:12 AM Performed by: Vista Deck, CRNA Pre-anesthesia Checklist: Patient identified, Emergency Drugs available, Suction available, Timeout performed and Patient being monitored Patient Re-evaluated:Patient Re-evaluated prior to induction Oxygen Delivery Method: Nasal Cannula

## 2017-01-15 ENCOUNTER — Encounter (HOSPITAL_COMMUNITY): Payer: Self-pay | Admitting: Ophthalmology

## 2017-02-05 DIAGNOSIS — E039 Hypothyroidism, unspecified: Secondary | ICD-10-CM | POA: Diagnosis not present

## 2017-02-05 DIAGNOSIS — K5190 Ulcerative colitis, unspecified, without complications: Secondary | ICD-10-CM | POA: Diagnosis not present

## 2017-02-05 DIAGNOSIS — K219 Gastro-esophageal reflux disease without esophagitis: Secondary | ICD-10-CM | POA: Diagnosis not present

## 2017-02-05 DIAGNOSIS — Z1389 Encounter for screening for other disorder: Secondary | ICD-10-CM | POA: Diagnosis not present

## 2017-02-05 DIAGNOSIS — Z6822 Body mass index (BMI) 22.0-22.9, adult: Secondary | ICD-10-CM | POA: Diagnosis not present

## 2017-02-05 DIAGNOSIS — E1165 Type 2 diabetes mellitus with hyperglycemia: Secondary | ICD-10-CM | POA: Diagnosis not present

## 2017-02-05 DIAGNOSIS — I10 Essential (primary) hypertension: Secondary | ICD-10-CM | POA: Diagnosis not present

## 2017-02-05 DIAGNOSIS — E8881 Metabolic syndrome: Secondary | ICD-10-CM | POA: Diagnosis not present

## 2017-02-27 DIAGNOSIS — M797 Fibromyalgia: Secondary | ICD-10-CM | POA: Diagnosis not present

## 2017-02-27 DIAGNOSIS — Z6823 Body mass index (BMI) 23.0-23.9, adult: Secondary | ICD-10-CM | POA: Diagnosis not present

## 2017-02-27 DIAGNOSIS — M545 Low back pain: Secondary | ICD-10-CM | POA: Diagnosis not present

## 2017-03-12 DIAGNOSIS — N6489 Other specified disorders of breast: Secondary | ICD-10-CM | POA: Diagnosis not present

## 2017-03-12 DIAGNOSIS — N6310 Unspecified lump in the right breast, unspecified quadrant: Secondary | ICD-10-CM | POA: Diagnosis not present

## 2017-03-12 DIAGNOSIS — R928 Other abnormal and inconclusive findings on diagnostic imaging of breast: Secondary | ICD-10-CM | POA: Diagnosis not present

## 2017-03-31 DIAGNOSIS — Z6822 Body mass index (BMI) 22.0-22.9, adult: Secondary | ICD-10-CM | POA: Diagnosis not present

## 2017-03-31 DIAGNOSIS — M545 Low back pain: Secondary | ICD-10-CM | POA: Diagnosis not present

## 2017-04-03 ENCOUNTER — Other Ambulatory Visit: Payer: Self-pay | Admitting: *Deleted

## 2017-04-03 ENCOUNTER — Other Ambulatory Visit: Payer: Self-pay | Admitting: Family Medicine

## 2017-04-03 DIAGNOSIS — M545 Low back pain: Secondary | ICD-10-CM

## 2017-04-03 MED ORDER — ATENOLOL 50 MG PO TABS: 25.0000 mg | ORAL_TABLET | Freq: Two times a day (BID) | ORAL | 1 refills | Status: DC

## 2017-04-16 ENCOUNTER — Ambulatory Visit
Admission: RE | Admit: 2017-04-16 | Discharge: 2017-04-16 | Disposition: A | Discharge status: Home / Self Care | Payer: PPO | Source: Ambulatory Visit | Attending: Family Medicine | Admitting: Family Medicine

## 2017-04-16 DIAGNOSIS — M48061 Spinal stenosis, lumbar region without neurogenic claudication: Secondary | ICD-10-CM | POA: Diagnosis not present

## 2017-04-16 DIAGNOSIS — M545 Low back pain: Secondary | ICD-10-CM

## 2017-04-22 DIAGNOSIS — M545 Low back pain: Secondary | ICD-10-CM | POA: Diagnosis not present

## 2017-04-22 DIAGNOSIS — Z6823 Body mass index (BMI) 23.0-23.9, adult: Secondary | ICD-10-CM | POA: Diagnosis not present

## 2017-04-22 DIAGNOSIS — K5100 Ulcerative (chronic) pancolitis without complications: Secondary | ICD-10-CM | POA: Diagnosis not present

## 2017-04-22 DIAGNOSIS — M4727 Other spondylosis with radiculopathy, lumbosacral region: Secondary | ICD-10-CM | POA: Diagnosis not present

## 2017-04-22 DIAGNOSIS — M4807 Spinal stenosis, lumbosacral region: Secondary | ICD-10-CM | POA: Diagnosis not present

## 2017-04-29 DIAGNOSIS — M5416 Radiculopathy, lumbar region: Secondary | ICD-10-CM | POA: Diagnosis not present

## 2017-06-09 DIAGNOSIS — M5416 Radiculopathy, lumbar region: Secondary | ICD-10-CM | POA: Diagnosis not present

## 2017-06-09 DIAGNOSIS — M25562 Pain in left knee: Secondary | ICD-10-CM | POA: Diagnosis not present

## 2017-06-09 DIAGNOSIS — M4722 Other spondylosis with radiculopathy, cervical region: Secondary | ICD-10-CM | POA: Diagnosis not present

## 2017-06-09 DIAGNOSIS — M25552 Pain in left hip: Secondary | ICD-10-CM | POA: Diagnosis not present

## 2017-06-12 DIAGNOSIS — R29898 Other symptoms and signs involving the musculoskeletal system: Secondary | ICD-10-CM | POA: Diagnosis not present

## 2017-06-12 DIAGNOSIS — M48062 Spinal stenosis, lumbar region with neurogenic claudication: Secondary | ICD-10-CM | POA: Diagnosis not present

## 2017-06-12 DIAGNOSIS — R200 Anesthesia of skin: Secondary | ICD-10-CM | POA: Diagnosis not present

## 2017-06-12 DIAGNOSIS — M5416 Radiculopathy, lumbar region: Secondary | ICD-10-CM | POA: Diagnosis not present

## 2017-06-16 ENCOUNTER — Other Ambulatory Visit: Payer: Self-pay | Admitting: Neurosurgery

## 2017-06-16 ENCOUNTER — Other Ambulatory Visit (HOSPITAL_COMMUNITY): Payer: Self-pay | Admitting: Neurosurgery

## 2017-06-16 DIAGNOSIS — M5416 Radiculopathy, lumbar region: Secondary | ICD-10-CM

## 2017-06-17 MED ORDER — SODIUM CHLORIDE 0.9 % IV SOLN: 4.0000 mg | Freq: Four times a day (QID) | INTRAVENOUS | Status: AC | PRN

## 2017-06-24 DIAGNOSIS — Z6821 Body mass index (BMI) 21.0-21.9, adult: Secondary | ICD-10-CM | POA: Diagnosis not present

## 2017-06-24 DIAGNOSIS — M545 Low back pain: Secondary | ICD-10-CM | POA: Diagnosis not present

## 2017-06-24 DIAGNOSIS — G3184 Mild cognitive impairment, so stated: Secondary | ICD-10-CM | POA: Diagnosis not present

## 2017-06-26 ENCOUNTER — Other Ambulatory Visit: Payer: Self-pay | Admitting: Neurosurgery

## 2017-06-27 DIAGNOSIS — G3184 Mild cognitive impairment, so stated: Secondary | ICD-10-CM | POA: Diagnosis not present

## 2017-07-01 ENCOUNTER — Ambulatory Visit (HOSPITAL_COMMUNITY)
Admission: RE | Admit: 2017-07-01 | Discharge: 2017-07-01 | Disposition: A | Discharge status: Home / Self Care | Payer: PPO | Source: Ambulatory Visit | Attending: Neurosurgery | Admitting: Neurosurgery

## 2017-07-01 DIAGNOSIS — M4807 Spinal stenosis, lumbosacral region: Secondary | ICD-10-CM | POA: Diagnosis not present

## 2017-07-01 DIAGNOSIS — M5136 Other intervertebral disc degeneration, lumbar region: Secondary | ICD-10-CM | POA: Diagnosis not present

## 2017-07-01 DIAGNOSIS — Z7952 Long term (current) use of systemic steroids: Secondary | ICD-10-CM | POA: Insufficient documentation

## 2017-07-01 DIAGNOSIS — Z79899 Other long term (current) drug therapy: Secondary | ICD-10-CM | POA: Insufficient documentation

## 2017-07-01 DIAGNOSIS — M5416 Radiculopathy, lumbar region: Secondary | ICD-10-CM | POA: Diagnosis not present

## 2017-07-01 DIAGNOSIS — M5440 Lumbago with sciatica, unspecified side: Secondary | ICD-10-CM | POA: Diagnosis not present

## 2017-07-01 DIAGNOSIS — Z79890 Hormone replacement therapy (postmenopausal): Secondary | ICD-10-CM | POA: Insufficient documentation

## 2017-07-01 DIAGNOSIS — M419 Scoliosis, unspecified: Secondary | ICD-10-CM | POA: Diagnosis not present

## 2017-07-01 DIAGNOSIS — M48061 Spinal stenosis, lumbar region without neurogenic claudication: Secondary | ICD-10-CM | POA: Diagnosis not present

## 2017-07-01 LAB — GLUCOSE, CAPILLARY
GLUCOSE-CAPILLARY: 160 mg/dL — AB (ref 65–99)
Glucose-Capillary: 150 mg/dL — ABNORMAL HIGH (ref 65–99)

## 2017-07-01 MED ORDER — TRAMADOL HCL 50 MG PO TABS: 50.0000 mg | ORAL_TABLET | ORAL | Status: DC | PRN

## 2017-07-01 MED ORDER — DIAZEPAM 5 MG PO TABS
ORAL_TABLET | ORAL | Status: AC
  Administered 2017-07-01: 10 mg via ORAL
  Filled 2017-07-01: qty 2

## 2017-07-01 MED ORDER — ONDANSETRON HCL 4 MG/2ML IJ SOLN: 4.0000 mg | Freq: Four times a day (QID) | INTRAMUSCULAR | Status: DC | PRN

## 2017-07-01 MED ORDER — IOPAMIDOL (ISOVUE-M 200) INJECTION 41%
INTRAMUSCULAR | Status: AC
  Administered 2017-07-01: 20 mL via INTRATHECAL
  Filled 2017-07-01: qty 10

## 2017-07-01 MED ORDER — LIDOCAINE HCL (PF) 1 % IJ SOLN
INTRAMUSCULAR | Status: AC
  Administered 2017-07-01: 7 mL via INTRADERMAL
  Filled 2017-07-01: qty 5

## 2017-07-01 MED ORDER — IOPAMIDOL (ISOVUE-M 200) INJECTION 41%
20.0000 mL | Freq: Once | INTRAMUSCULAR | Status: AC
  Administered 2017-07-01: 20 mL via INTRATHECAL

## 2017-07-01 MED ORDER — OXYCODONE HCL 5 MG PO TABS: 5.0000 mg | ORAL_TABLET | ORAL | Status: DC | PRN

## 2017-07-01 MED ORDER — LIDOCAINE HCL (PF) 1 % IJ SOLN
5.0000 mL | Freq: Once | INTRAMUSCULAR | Status: AC
  Administered 2017-07-01: 7 mL via INTRADERMAL

## 2017-07-01 MED ORDER — DIAZEPAM 5 MG PO TABS
10.0000 mg | ORAL_TABLET | Freq: Once | ORAL | Status: AC
  Administered 2017-07-01: 10 mg via ORAL

## 2017-07-01 NOTE — Discharge Instructions (Signed)

## 2017-07-01 NOTE — Op Note (Signed)
07/01/2017 lumbar Myelogram  PATIENT:  Tina Schultz is a 72 y.o. female  PRE-OPERATIVE DIAGNOSIS:  Lumbar sciatica, scoliosis  POST-OPERATIVE DIAGNOSIS:  same  PROCEDURE:  Lumbar Myelogram  SURGEON:  Erron Wengert  ANESTHESIA:   local LOCAL MEDICATIONS USED:  LIDOCAINE  Procedure Note: Tina Schultz is a 72 y.o. female Was taken to the fluoroscopy suite and  positioned prone on the fluoroscopy table. her back was prepared and draped in a sterile manner. I infiltrated 8 cc into the lumbar region. I then introduced a spinal needle into the thecal sac at the l4/5 interlaminar space. I infiltrated 20cc of Isovue 200 into the thecal sac. Fluoroscopy showed the needle and contrast in the thecal sac. Tina Schultz tolerated the procedure well. she Will be taken to CT for evaluation.     PATIENT DISPOSITION:  PACU - hemodynamically stable.

## 2017-07-03 ENCOUNTER — Other Ambulatory Visit: Payer: Self-pay | Admitting: Neurosurgery

## 2017-07-03 DIAGNOSIS — M48062 Spinal stenosis, lumbar region with neurogenic claudication: Secondary | ICD-10-CM | POA: Diagnosis not present

## 2017-07-11 DIAGNOSIS — I10 Essential (primary) hypertension: Secondary | ICD-10-CM | POA: Diagnosis not present

## 2017-07-11 DIAGNOSIS — E8881 Metabolic syndrome: Secondary | ICD-10-CM | POA: Diagnosis not present

## 2017-07-11 DIAGNOSIS — K219 Gastro-esophageal reflux disease without esophagitis: Secondary | ICD-10-CM | POA: Diagnosis not present

## 2017-07-11 DIAGNOSIS — E1165 Type 2 diabetes mellitus with hyperglycemia: Secondary | ICD-10-CM | POA: Diagnosis not present

## 2017-07-11 DIAGNOSIS — K5190 Ulcerative colitis, unspecified, without complications: Secondary | ICD-10-CM | POA: Diagnosis not present

## 2017-07-11 DIAGNOSIS — M797 Fibromyalgia: Secondary | ICD-10-CM | POA: Diagnosis not present

## 2017-07-11 DIAGNOSIS — E039 Hypothyroidism, unspecified: Secondary | ICD-10-CM | POA: Diagnosis not present

## 2017-07-11 DIAGNOSIS — Z6821 Body mass index (BMI) 21.0-21.9, adult: Secondary | ICD-10-CM | POA: Diagnosis not present

## 2017-07-14 ENCOUNTER — Inpatient Hospital Stay (HOSPITAL_COMMUNITY): Admission: RE | Admit: 2017-07-14 | Payer: PPO | Source: Ambulatory Visit

## 2017-07-15 DIAGNOSIS — R531 Weakness: Secondary | ICD-10-CM | POA: Diagnosis not present

## 2017-07-15 DIAGNOSIS — M797 Fibromyalgia: Secondary | ICD-10-CM | POA: Diagnosis not present

## 2017-07-15 DIAGNOSIS — R262 Difficulty in walking, not elsewhere classified: Secondary | ICD-10-CM | POA: Diagnosis not present

## 2017-07-15 DIAGNOSIS — I10 Essential (primary) hypertension: Secondary | ICD-10-CM | POA: Diagnosis not present

## 2017-07-15 DIAGNOSIS — M48062 Spinal stenosis, lumbar region with neurogenic claudication: Secondary | ICD-10-CM | POA: Diagnosis not present

## 2017-07-15 DIAGNOSIS — E119 Type 2 diabetes mellitus without complications: Secondary | ICD-10-CM | POA: Diagnosis not present

## 2017-07-15 DIAGNOSIS — M1990 Unspecified osteoarthritis, unspecified site: Secondary | ICD-10-CM | POA: Diagnosis not present

## 2017-07-16 ENCOUNTER — Inpatient Hospital Stay (HOSPITAL_COMMUNITY): Admission: RE | Admit: 2017-07-16 | Payer: PPO | Source: Ambulatory Visit | Admitting: Neurosurgery

## 2017-07-16 ENCOUNTER — Encounter (HOSPITAL_COMMUNITY): Admission: RE | Payer: Self-pay | Source: Ambulatory Visit

## 2017-07-16 SURGERY — POSTERIOR LUMBAR FUSION 2 LEVEL
Anesthesia: General

## 2017-07-29 DIAGNOSIS — M810 Age-related osteoporosis without current pathological fracture: Secondary | ICD-10-CM | POA: Diagnosis not present

## 2017-07-29 DIAGNOSIS — Z780 Asymptomatic menopausal state: Secondary | ICD-10-CM | POA: Diagnosis not present

## 2017-07-29 DIAGNOSIS — M85852 Other specified disorders of bone density and structure, left thigh: Secondary | ICD-10-CM | POA: Diagnosis not present

## 2017-08-04 ENCOUNTER — Other Ambulatory Visit: Payer: Self-pay

## 2017-08-04 DIAGNOSIS — R262 Difficulty in walking, not elsewhere classified: Secondary | ICD-10-CM | POA: Diagnosis not present

## 2017-08-04 DIAGNOSIS — M48062 Spinal stenosis, lumbar region with neurogenic claudication: Secondary | ICD-10-CM | POA: Diagnosis not present

## 2017-08-04 DIAGNOSIS — R531 Weakness: Secondary | ICD-10-CM | POA: Diagnosis not present

## 2017-08-04 NOTE — Patient Outreach (Signed)
[facility]) Care Management  08/04/2017  Tina Schultz [birth year] [id number]   Referral Date: 08/01/17 Referral Source: Nurse line Referral Reason: Leg pain and insurance denied surgery   Outreach Attempt: Spoke with patient.  She is able to verify HIPAA.  Discussed reason for referral.  She states that she did not go to the ER but did call her doctor who was not in on Friday but states that the doctor is supposed to call her today.  Patient reports that her pain makes her not able to do much due to pain.  Patient is taking tramadol and flexeril and states it does help with some of her pain. Patient does see pain management.  She reports that her spouse helps with everything.  Insurance has denied her surgery as she has not had therapy.  Patient reports she is now in therapy for her back.  She states prior to her back and leg problems she was active and going to silver sneakers.  Advised patient to follow recommendations from the insurance and follow up with her physician.  She verbalized understanding.  Patient appreciative of call and denies any needs presently.     Plan: RN CM will send letter and close case.   Jone Baseman, RN, MSN [facility] Care Management Care Management Coordinator Direct Line [phone] Toll Free: [phone]  Fax: [phone]

## 2017-08-05 DIAGNOSIS — M791 Myalgia, unspecified site: Secondary | ICD-10-CM | POA: Diagnosis not present

## 2017-08-05 DIAGNOSIS — M545 Low back pain: Secondary | ICD-10-CM | POA: Diagnosis not present

## 2017-08-05 DIAGNOSIS — Z6822 Body mass index (BMI) 22.0-22.9, adult: Secondary | ICD-10-CM | POA: Diagnosis not present

## 2017-09-02 DIAGNOSIS — E039 Hypothyroidism, unspecified: Secondary | ICD-10-CM | POA: Diagnosis not present

## 2017-09-02 DIAGNOSIS — F331 Major depressive disorder, recurrent, moderate: Secondary | ICD-10-CM | POA: Diagnosis not present

## 2017-09-02 DIAGNOSIS — K5190 Ulcerative colitis, unspecified, without complications: Secondary | ICD-10-CM | POA: Diagnosis not present

## 2017-09-02 DIAGNOSIS — E1165 Type 2 diabetes mellitus with hyperglycemia: Secondary | ICD-10-CM | POA: Diagnosis not present

## 2017-09-04 DIAGNOSIS — R531 Weakness: Secondary | ICD-10-CM | POA: Diagnosis not present

## 2017-09-04 DIAGNOSIS — R262 Difficulty in walking, not elsewhere classified: Secondary | ICD-10-CM | POA: Diagnosis not present

## 2017-09-04 DIAGNOSIS — M48062 Spinal stenosis, lumbar region with neurogenic claudication: Secondary | ICD-10-CM | POA: Diagnosis not present

## 2017-09-22 DIAGNOSIS — M545 Low back pain: Secondary | ICD-10-CM | POA: Diagnosis not present

## 2017-09-22 DIAGNOSIS — Z6822 Body mass index (BMI) 22.0-22.9, adult: Secondary | ICD-10-CM | POA: Diagnosis not present

## 2017-09-22 DIAGNOSIS — M791 Myalgia, unspecified site: Secondary | ICD-10-CM | POA: Diagnosis not present

## 2017-10-09 DIAGNOSIS — E038 Other specified hypothyroidism: Secondary | ICD-10-CM | POA: Diagnosis not present

## 2017-10-09 DIAGNOSIS — E1165 Type 2 diabetes mellitus with hyperglycemia: Secondary | ICD-10-CM | POA: Diagnosis not present

## 2017-10-09 DIAGNOSIS — I10 Essential (primary) hypertension: Secondary | ICD-10-CM | POA: Diagnosis not present

## 2017-10-09 DIAGNOSIS — K219 Gastro-esophageal reflux disease without esophagitis: Secondary | ICD-10-CM | POA: Diagnosis not present

## 2017-10-09 DIAGNOSIS — Z9189 Other specified personal risk factors, not elsewhere classified: Secondary | ICD-10-CM | POA: Diagnosis not present

## 2017-10-09 DIAGNOSIS — E039 Hypothyroidism, unspecified: Secondary | ICD-10-CM | POA: Diagnosis not present

## 2017-10-09 DIAGNOSIS — E8881 Metabolic syndrome: Secondary | ICD-10-CM | POA: Diagnosis not present

## 2017-10-09 DIAGNOSIS — M797 Fibromyalgia: Secondary | ICD-10-CM | POA: Diagnosis not present

## 2017-10-23 DIAGNOSIS — Z961 Presence of intraocular lens: Secondary | ICD-10-CM | POA: Diagnosis not present

## 2017-10-29 DIAGNOSIS — R634 Abnormal weight loss: Secondary | ICD-10-CM | POA: Diagnosis not present

## 2017-10-29 DIAGNOSIS — Z6822 Body mass index (BMI) 22.0-22.9, adult: Secondary | ICD-10-CM | POA: Diagnosis not present

## 2017-10-29 DIAGNOSIS — R5383 Other fatigue: Secondary | ICD-10-CM | POA: Diagnosis not present

## 2017-10-29 DIAGNOSIS — M7989 Other specified soft tissue disorders: Secondary | ICD-10-CM | POA: Diagnosis not present

## 2017-10-29 DIAGNOSIS — M2550 Pain in unspecified joint: Secondary | ICD-10-CM | POA: Diagnosis not present

## 2017-11-03 DIAGNOSIS — K219 Gastro-esophageal reflux disease without esophagitis: Secondary | ICD-10-CM | POA: Diagnosis not present

## 2017-11-03 DIAGNOSIS — E039 Hypothyroidism, unspecified: Secondary | ICD-10-CM | POA: Diagnosis not present

## 2017-11-03 DIAGNOSIS — I10 Essential (primary) hypertension: Secondary | ICD-10-CM | POA: Diagnosis not present

## 2017-11-07 DIAGNOSIS — R300 Dysuria: Secondary | ICD-10-CM | POA: Diagnosis not present

## 2017-11-07 DIAGNOSIS — N760 Acute vaginitis: Secondary | ICD-10-CM | POA: Diagnosis not present

## 2017-11-07 DIAGNOSIS — N390 Urinary tract infection, site not specified: Secondary | ICD-10-CM | POA: Diagnosis not present

## 2017-11-18 DIAGNOSIS — E1169 Type 2 diabetes mellitus with other specified complication: Secondary | ICD-10-CM | POA: Diagnosis not present

## 2017-11-18 DIAGNOSIS — I10 Essential (primary) hypertension: Secondary | ICD-10-CM | POA: Diagnosis not present

## 2017-11-18 DIAGNOSIS — Z23 Encounter for immunization: Secondary | ICD-10-CM | POA: Diagnosis not present

## 2017-11-18 DIAGNOSIS — Z6822 Body mass index (BMI) 22.0-22.9, adult: Secondary | ICD-10-CM | POA: Diagnosis not present

## 2017-11-18 DIAGNOSIS — Z0001 Encounter for general adult medical examination with abnormal findings: Secondary | ICD-10-CM | POA: Diagnosis not present

## 2017-11-26 DIAGNOSIS — R634 Abnormal weight loss: Secondary | ICD-10-CM | POA: Diagnosis not present

## 2017-11-26 DIAGNOSIS — M2550 Pain in unspecified joint: Secondary | ICD-10-CM | POA: Diagnosis not present

## 2017-11-26 DIAGNOSIS — R5383 Other fatigue: Secondary | ICD-10-CM | POA: Diagnosis not present

## 2017-11-26 DIAGNOSIS — Z6822 Body mass index (BMI) 22.0-22.9, adult: Secondary | ICD-10-CM | POA: Diagnosis not present

## 2017-11-26 DIAGNOSIS — M353 Polymyalgia rheumatica: Secondary | ICD-10-CM | POA: Diagnosis not present

## 2017-11-26 DIAGNOSIS — M7989 Other specified soft tissue disorders: Secondary | ICD-10-CM | POA: Diagnosis not present

## 2017-12-31 DIAGNOSIS — M353 Polymyalgia rheumatica: Secondary | ICD-10-CM | POA: Diagnosis not present

## 2018-01-06 DIAGNOSIS — J329 Chronic sinusitis, unspecified: Secondary | ICD-10-CM | POA: Diagnosis not present

## 2018-01-06 DIAGNOSIS — Z6822 Body mass index (BMI) 22.0-22.9, adult: Secondary | ICD-10-CM | POA: Diagnosis not present

## 2018-01-21 DIAGNOSIS — Z6822 Body mass index (BMI) 22.0-22.9, adult: Secondary | ICD-10-CM | POA: Diagnosis not present

## 2018-01-21 DIAGNOSIS — M7989 Other specified soft tissue disorders: Secondary | ICD-10-CM | POA: Diagnosis not present

## 2018-01-21 DIAGNOSIS — R634 Abnormal weight loss: Secondary | ICD-10-CM | POA: Diagnosis not present

## 2018-01-21 DIAGNOSIS — M353 Polymyalgia rheumatica: Secondary | ICD-10-CM | POA: Diagnosis not present

## 2018-01-21 DIAGNOSIS — R5383 Other fatigue: Secondary | ICD-10-CM | POA: Diagnosis not present

## 2018-01-21 DIAGNOSIS — M2550 Pain in unspecified joint: Secondary | ICD-10-CM | POA: Diagnosis not present

## 2018-02-02 DIAGNOSIS — E039 Hypothyroidism, unspecified: Secondary | ICD-10-CM | POA: Diagnosis not present

## 2018-02-02 DIAGNOSIS — K219 Gastro-esophageal reflux disease without esophagitis: Secondary | ICD-10-CM | POA: Diagnosis not present

## 2018-02-02 DIAGNOSIS — I10 Essential (primary) hypertension: Secondary | ICD-10-CM | POA: Diagnosis not present

## 2018-03-04 DIAGNOSIS — H524 Presbyopia: Secondary | ICD-10-CM | POA: Diagnosis not present

## 2018-03-04 DIAGNOSIS — H5203 Hypermetropia, bilateral: Secondary | ICD-10-CM | POA: Diagnosis not present

## 2018-03-04 DIAGNOSIS — E119 Type 2 diabetes mellitus without complications: Secondary | ICD-10-CM | POA: Diagnosis not present

## 2018-03-04 DIAGNOSIS — H401131 Primary open-angle glaucoma, bilateral, mild stage: Secondary | ICD-10-CM | POA: Diagnosis not present

## 2018-03-04 DIAGNOSIS — Z961 Presence of intraocular lens: Secondary | ICD-10-CM | POA: Diagnosis not present

## 2018-03-05 DIAGNOSIS — E039 Hypothyroidism, unspecified: Secondary | ICD-10-CM | POA: Diagnosis not present

## 2018-03-05 DIAGNOSIS — E1169 Type 2 diabetes mellitus with other specified complication: Secondary | ICD-10-CM | POA: Diagnosis not present

## 2018-03-05 DIAGNOSIS — I10 Essential (primary) hypertension: Secondary | ICD-10-CM | POA: Diagnosis not present

## 2018-03-18 DIAGNOSIS — Z6822 Body mass index (BMI) 22.0-22.9, adult: Secondary | ICD-10-CM | POA: Diagnosis not present

## 2018-03-18 DIAGNOSIS — Z1331 Encounter for screening for depression: Secondary | ICD-10-CM | POA: Diagnosis not present

## 2018-03-18 DIAGNOSIS — K5190 Ulcerative colitis, unspecified, without complications: Secondary | ICD-10-CM | POA: Diagnosis not present

## 2018-03-18 DIAGNOSIS — F331 Major depressive disorder, recurrent, moderate: Secondary | ICD-10-CM | POA: Diagnosis not present

## 2018-03-18 DIAGNOSIS — Z1389 Encounter for screening for other disorder: Secondary | ICD-10-CM | POA: Diagnosis not present

## 2018-03-18 DIAGNOSIS — I10 Essential (primary) hypertension: Secondary | ICD-10-CM | POA: Diagnosis not present

## 2018-03-18 DIAGNOSIS — Z1212 Encounter for screening for malignant neoplasm of rectum: Secondary | ICD-10-CM | POA: Diagnosis not present

## 2018-03-18 DIAGNOSIS — Z0001 Encounter for general adult medical examination with abnormal findings: Secondary | ICD-10-CM | POA: Diagnosis not present

## 2018-03-18 DIAGNOSIS — E1169 Type 2 diabetes mellitus with other specified complication: Secondary | ICD-10-CM | POA: Diagnosis not present

## 2018-03-18 DIAGNOSIS — K219 Gastro-esophageal reflux disease without esophagitis: Secondary | ICD-10-CM | POA: Diagnosis not present

## 2018-03-18 DIAGNOSIS — M797 Fibromyalgia: Secondary | ICD-10-CM | POA: Diagnosis not present

## 2018-03-18 DIAGNOSIS — E8881 Metabolic syndrome: Secondary | ICD-10-CM | POA: Diagnosis not present

## 2018-03-18 DIAGNOSIS — E039 Hypothyroidism, unspecified: Secondary | ICD-10-CM | POA: Diagnosis not present

## 2018-03-18 DIAGNOSIS — Z9189 Other specified personal risk factors, not elsewhere classified: Secondary | ICD-10-CM | POA: Diagnosis not present

## 2018-03-18 DIAGNOSIS — Z23 Encounter for immunization: Secondary | ICD-10-CM | POA: Diagnosis not present

## 2018-03-30 DIAGNOSIS — R1013 Epigastric pain: Secondary | ICD-10-CM | POA: Diagnosis not present

## 2018-03-30 DIAGNOSIS — H538 Other visual disturbances: Secondary | ICD-10-CM | POA: Diagnosis not present

## 2018-03-30 DIAGNOSIS — M353 Polymyalgia rheumatica: Secondary | ICD-10-CM | POA: Diagnosis not present

## 2018-03-30 DIAGNOSIS — Z6823 Body mass index (BMI) 23.0-23.9, adult: Secondary | ICD-10-CM | POA: Diagnosis not present

## 2018-03-30 DIAGNOSIS — M2550 Pain in unspecified joint: Secondary | ICD-10-CM | POA: Diagnosis not present

## 2018-03-30 DIAGNOSIS — R634 Abnormal weight loss: Secondary | ICD-10-CM | POA: Diagnosis not present

## 2018-03-30 DIAGNOSIS — R5383 Other fatigue: Secondary | ICD-10-CM | POA: Diagnosis not present

## 2018-04-03 DIAGNOSIS — F331 Major depressive disorder, recurrent, moderate: Secondary | ICD-10-CM | POA: Diagnosis not present

## 2018-04-03 DIAGNOSIS — E039 Hypothyroidism, unspecified: Secondary | ICD-10-CM | POA: Diagnosis not present

## 2018-04-03 DIAGNOSIS — I10 Essential (primary) hypertension: Secondary | ICD-10-CM | POA: Diagnosis not present

## 2018-05-04 DIAGNOSIS — I10 Essential (primary) hypertension: Secondary | ICD-10-CM | POA: Diagnosis not present

## 2018-05-04 DIAGNOSIS — E039 Hypothyroidism, unspecified: Secondary | ICD-10-CM | POA: Diagnosis not present

## 2018-05-04 DIAGNOSIS — F331 Major depressive disorder, recurrent, moderate: Secondary | ICD-10-CM | POA: Diagnosis not present

## 2018-05-14 DIAGNOSIS — F411 Generalized anxiety disorder: Secondary | ICD-10-CM | POA: Diagnosis not present

## 2018-05-14 DIAGNOSIS — E039 Hypothyroidism, unspecified: Secondary | ICD-10-CM | POA: Diagnosis not present

## 2018-06-24 DIAGNOSIS — E039 Hypothyroidism, unspecified: Secondary | ICD-10-CM | POA: Diagnosis not present

## 2018-07-04 DIAGNOSIS — E039 Hypothyroidism, unspecified: Secondary | ICD-10-CM | POA: Diagnosis not present

## 2018-07-04 DIAGNOSIS — F331 Major depressive disorder, recurrent, moderate: Secondary | ICD-10-CM | POA: Diagnosis not present

## 2018-07-06 DIAGNOSIS — H538 Other visual disturbances: Secondary | ICD-10-CM | POA: Diagnosis not present

## 2018-07-06 DIAGNOSIS — H401131 Primary open-angle glaucoma, bilateral, mild stage: Secondary | ICD-10-CM | POA: Diagnosis not present

## 2018-07-13 DIAGNOSIS — K5732 Diverticulitis of large intestine without perforation or abscess without bleeding: Secondary | ICD-10-CM | POA: Diagnosis not present

## 2018-07-13 DIAGNOSIS — I10 Essential (primary) hypertension: Secondary | ICD-10-CM | POA: Diagnosis not present

## 2018-07-13 DIAGNOSIS — E038 Other specified hypothyroidism: Secondary | ICD-10-CM | POA: Diagnosis not present

## 2018-07-13 DIAGNOSIS — E8881 Metabolic syndrome: Secondary | ICD-10-CM | POA: Diagnosis not present

## 2018-07-13 DIAGNOSIS — E1165 Type 2 diabetes mellitus with hyperglycemia: Secondary | ICD-10-CM | POA: Diagnosis not present

## 2018-07-13 DIAGNOSIS — K219 Gastro-esophageal reflux disease without esophagitis: Secondary | ICD-10-CM | POA: Diagnosis not present

## 2018-07-16 DIAGNOSIS — Z6822 Body mass index (BMI) 22.0-22.9, adult: Secondary | ICD-10-CM | POA: Diagnosis not present

## 2018-07-16 DIAGNOSIS — M353 Polymyalgia rheumatica: Secondary | ICD-10-CM | POA: Diagnosis not present

## 2018-07-16 DIAGNOSIS — F331 Major depressive disorder, recurrent, moderate: Secondary | ICD-10-CM | POA: Diagnosis not present

## 2018-07-16 DIAGNOSIS — E8881 Metabolic syndrome: Secondary | ICD-10-CM | POA: Diagnosis not present

## 2018-07-16 DIAGNOSIS — E039 Hypothyroidism, unspecified: Secondary | ICD-10-CM | POA: Diagnosis not present

## 2018-07-16 DIAGNOSIS — K5190 Ulcerative colitis, unspecified, without complications: Secondary | ICD-10-CM | POA: Diagnosis not present

## 2018-07-16 DIAGNOSIS — I10 Essential (primary) hypertension: Secondary | ICD-10-CM | POA: Diagnosis not present

## 2018-07-16 DIAGNOSIS — E1165 Type 2 diabetes mellitus with hyperglycemia: Secondary | ICD-10-CM | POA: Diagnosis not present

## 2018-07-30 DIAGNOSIS — R634 Abnormal weight loss: Secondary | ICD-10-CM | POA: Diagnosis not present

## 2018-07-30 DIAGNOSIS — M2550 Pain in unspecified joint: Secondary | ICD-10-CM | POA: Diagnosis not present

## 2018-07-30 DIAGNOSIS — H538 Other visual disturbances: Secondary | ICD-10-CM | POA: Diagnosis not present

## 2018-07-30 DIAGNOSIS — Z6823 Body mass index (BMI) 23.0-23.9, adult: Secondary | ICD-10-CM | POA: Diagnosis not present

## 2018-07-30 DIAGNOSIS — R159 Full incontinence of feces: Secondary | ICD-10-CM | POA: Diagnosis not present

## 2018-07-30 DIAGNOSIS — R1013 Epigastric pain: Secondary | ICD-10-CM | POA: Diagnosis not present

## 2018-07-30 DIAGNOSIS — R5383 Other fatigue: Secondary | ICD-10-CM | POA: Diagnosis not present

## 2018-07-30 DIAGNOSIS — M353 Polymyalgia rheumatica: Secondary | ICD-10-CM | POA: Diagnosis not present

## 2018-08-04 DIAGNOSIS — E785 Hyperlipidemia, unspecified: Secondary | ICD-10-CM | POA: Diagnosis not present

## 2018-08-04 DIAGNOSIS — I10 Essential (primary) hypertension: Secondary | ICD-10-CM | POA: Diagnosis not present

## 2018-08-25 DIAGNOSIS — Z6823 Body mass index (BMI) 23.0-23.9, adult: Secondary | ICD-10-CM | POA: Diagnosis not present

## 2018-08-25 DIAGNOSIS — I951 Orthostatic hypotension: Secondary | ICD-10-CM | POA: Diagnosis not present

## 2018-09-04 DIAGNOSIS — E1165 Type 2 diabetes mellitus with hyperglycemia: Secondary | ICD-10-CM | POA: Diagnosis not present

## 2018-09-04 DIAGNOSIS — I10 Essential (primary) hypertension: Secondary | ICD-10-CM | POA: Diagnosis not present

## 2018-09-10 ENCOUNTER — Telehealth: Payer: Self-pay | Admitting: Cardiology

## 2018-09-10 DIAGNOSIS — R002 Palpitations: Secondary | ICD-10-CM

## 2018-09-10 DIAGNOSIS — R42 Dizziness and giddiness: Secondary | ICD-10-CM

## 2018-09-10 NOTE — Telephone Encounter (Signed)
Pt c/o dizzy spells several times a day lasting a few minutes at the time with tingling sensations in her legs - went to Dr Olena Heckle who decreased Atenolol 25 mg daily - says last BP reading was 134/67 on Monday with HR 54 yesterday 128/75 HR 81 - denies any chest pain/SOB/swelling

## 2018-09-10 NOTE — Telephone Encounter (Signed)
Symptoms have been ongoing for the last month

## 2018-09-10 NOTE — Telephone Encounter (Signed)
Patient has been having dizzy spells during the day some at night but mostly during the day.  When she has these spells, her left leg and arm get tingling feeling

## 2018-09-11 NOTE — Telephone Encounter (Signed)
Patient called back to make a correction stating that she is not taking lisinopril. Taken off med list.

## 2018-09-11 NOTE — Telephone Encounter (Signed)
Can we update her med list for the atenolol and lisinopril. She has had some low heart rates in the past could be contributing. Can we get a 48 hr holter monitor for her for dizzienss   Zandra Abts MD

## 2018-09-11 NOTE — Telephone Encounter (Signed)
Patient informed and verbalized understanding Medication list updated. Enrolled for ZIO patch

## 2018-09-11 NOTE — Telephone Encounter (Signed)
Spoke with patient and she says her dizziness occurs several times throughout the day and happens at night when laying in bed or just sitting or standing. Has been going on for a month. Had visit with Dr. Quillian Quince on 08/05/2018 and was told that BP was a little low and atenolol was decreased to 25 mg daily. Says she is staying hydrated and drinks 4 16 oz glasses of water per day. Verified that is taking lisinopril 2.5 mg daily.

## 2018-09-11 NOTE — Telephone Encounter (Signed)
Is there any specific trigger to her dizziness. Does it tend to come on with standing? Is she staying well hydrated during the day (how much water intake?)   Zandra Abts MD

## 2018-10-05 DIAGNOSIS — I10 Essential (primary) hypertension: Secondary | ICD-10-CM | POA: Diagnosis not present

## 2018-10-05 DIAGNOSIS — E039 Hypothyroidism, unspecified: Secondary | ICD-10-CM | POA: Diagnosis not present

## 2018-10-20 ENCOUNTER — Other Ambulatory Visit (INDEPENDENT_AMBULATORY_CARE_PROVIDER_SITE_OTHER): Payer: PPO

## 2018-10-20 DIAGNOSIS — R002 Palpitations: Secondary | ICD-10-CM

## 2018-10-20 DIAGNOSIS — R42 Dizziness and giddiness: Secondary | ICD-10-CM | POA: Diagnosis not present

## 2018-11-04 DIAGNOSIS — E785 Hyperlipidemia, unspecified: Secondary | ICD-10-CM | POA: Diagnosis not present

## 2018-11-04 DIAGNOSIS — I10 Essential (primary) hypertension: Secondary | ICD-10-CM | POA: Diagnosis not present

## 2018-11-05 DIAGNOSIS — H401131 Primary open-angle glaucoma, bilateral, mild stage: Secondary | ICD-10-CM | POA: Diagnosis not present

## 2018-11-11 ENCOUNTER — Telehealth: Payer: Self-pay | Admitting: *Deleted

## 2018-11-11 NOTE — Telephone Encounter (Signed)
-----   Message from Arnoldo Lenis, MD sent at 11/10/2018  2:25 PM EDT ----- Just some occasional extra heart beats at times on her monitor, sometimes can have a few in a row. This is considered benign. Nothing by monitor to explain her dizziness, no significant low heart rates noted. Are her symptoms any better since lowering atenolol   J BrancH MD

## 2018-11-11 NOTE — Telephone Encounter (Signed)
LM to return call.

## 2018-11-12 NOTE — Telephone Encounter (Signed)
Pt voiced understanding - denies any symptoms since decrease of atenolol

## 2018-11-20 ENCOUNTER — Ambulatory Visit: Payer: PPO | Admitting: Cardiology

## 2018-11-24 DIAGNOSIS — M48062 Spinal stenosis, lumbar region with neurogenic claudication: Secondary | ICD-10-CM | POA: Diagnosis not present

## 2018-11-30 DIAGNOSIS — K219 Gastro-esophageal reflux disease without esophagitis: Secondary | ICD-10-CM | POA: Diagnosis not present

## 2018-11-30 DIAGNOSIS — E038 Other specified hypothyroidism: Secondary | ICD-10-CM | POA: Diagnosis not present

## 2018-11-30 DIAGNOSIS — E1165 Type 2 diabetes mellitus with hyperglycemia: Secondary | ICD-10-CM | POA: Diagnosis not present

## 2018-11-30 DIAGNOSIS — I10 Essential (primary) hypertension: Secondary | ICD-10-CM | POA: Diagnosis not present

## 2018-11-30 DIAGNOSIS — R42 Dizziness and giddiness: Secondary | ICD-10-CM | POA: Diagnosis not present

## 2018-11-30 DIAGNOSIS — E039 Hypothyroidism, unspecified: Secondary | ICD-10-CM | POA: Diagnosis not present

## 2018-11-30 DIAGNOSIS — M797 Fibromyalgia: Secondary | ICD-10-CM | POA: Diagnosis not present

## 2018-11-30 DIAGNOSIS — E8881 Metabolic syndrome: Secondary | ICD-10-CM | POA: Diagnosis not present

## 2018-12-04 DIAGNOSIS — I10 Essential (primary) hypertension: Secondary | ICD-10-CM | POA: Diagnosis not present

## 2018-12-04 DIAGNOSIS — E1169 Type 2 diabetes mellitus with other specified complication: Secondary | ICD-10-CM | POA: Diagnosis not present

## 2018-12-04 DIAGNOSIS — Z0001 Encounter for general adult medical examination with abnormal findings: Secondary | ICD-10-CM | POA: Diagnosis not present

## 2018-12-04 DIAGNOSIS — E1165 Type 2 diabetes mellitus with hyperglycemia: Secondary | ICD-10-CM | POA: Diagnosis not present

## 2018-12-04 DIAGNOSIS — E782 Mixed hyperlipidemia: Secondary | ICD-10-CM | POA: Diagnosis not present

## 2018-12-04 DIAGNOSIS — M353 Polymyalgia rheumatica: Secondary | ICD-10-CM | POA: Diagnosis not present

## 2018-12-04 DIAGNOSIS — K5190 Ulcerative colitis, unspecified, without complications: Secondary | ICD-10-CM | POA: Diagnosis not present

## 2018-12-04 DIAGNOSIS — Z1212 Encounter for screening for malignant neoplasm of rectum: Secondary | ICD-10-CM | POA: Diagnosis not present

## 2019-01-04 DIAGNOSIS — I10 Essential (primary) hypertension: Secondary | ICD-10-CM | POA: Diagnosis not present

## 2019-01-04 DIAGNOSIS — M797 Fibromyalgia: Secondary | ICD-10-CM | POA: Diagnosis not present

## 2019-01-21 ENCOUNTER — Other Ambulatory Visit: Payer: Self-pay | Admitting: Physician Assistant

## 2019-01-21 DIAGNOSIS — R634 Abnormal weight loss: Secondary | ICD-10-CM | POA: Diagnosis not present

## 2019-01-21 DIAGNOSIS — Z6823 Body mass index (BMI) 23.0-23.9, adult: Secondary | ICD-10-CM | POA: Diagnosis not present

## 2019-01-21 DIAGNOSIS — Z7952 Long term (current) use of systemic steroids: Secondary | ICD-10-CM

## 2019-01-21 DIAGNOSIS — R159 Full incontinence of feces: Secondary | ICD-10-CM | POA: Diagnosis not present

## 2019-01-21 DIAGNOSIS — M2550 Pain in unspecified joint: Secondary | ICD-10-CM | POA: Diagnosis not present

## 2019-01-21 DIAGNOSIS — R5383 Other fatigue: Secondary | ICD-10-CM | POA: Diagnosis not present

## 2019-01-21 DIAGNOSIS — M353 Polymyalgia rheumatica: Secondary | ICD-10-CM | POA: Diagnosis not present

## 2019-02-04 DIAGNOSIS — M797 Fibromyalgia: Secondary | ICD-10-CM | POA: Diagnosis not present

## 2019-02-04 DIAGNOSIS — I10 Essential (primary) hypertension: Secondary | ICD-10-CM | POA: Diagnosis not present

## 2019-02-08 ENCOUNTER — Ambulatory Visit: Payer: PPO | Admitting: Cardiology

## 2019-02-09 DIAGNOSIS — M2550 Pain in unspecified joint: Secondary | ICD-10-CM | POA: Diagnosis not present

## 2019-03-02 ENCOUNTER — Other Ambulatory Visit: Payer: Self-pay

## 2019-03-02 ENCOUNTER — Encounter: Payer: Self-pay | Admitting: Cardiology

## 2019-03-02 ENCOUNTER — Ambulatory Visit (INDEPENDENT_AMBULATORY_CARE_PROVIDER_SITE_OTHER): Payer: PPO | Admitting: Cardiology

## 2019-03-02 VITALS — BP 110/65 | HR 68 | Ht 64.0 in | Wt 134.2 lb

## 2019-03-02 DIAGNOSIS — R42 Dizziness and giddiness: Secondary | ICD-10-CM | POA: Diagnosis not present

## 2019-03-02 DIAGNOSIS — R002 Palpitations: Secondary | ICD-10-CM

## 2019-03-02 MED ORDER — MIDODRINE HCL 2.5 MG PO TABS: 2.5000 mg | ORAL_TABLET | Freq: Two times a day (BID) | ORAL | 3 refills | Status: DC

## 2019-03-02 NOTE — Progress Notes (Signed)
Clinical Summary Tina Schultz is a 74 y.o.female seen today for follow up of the following medical problems.   1. Palpitations - seen in ER 03/28/16 with palpitations - symptoms started 8 weeks ago. Feeling of heart pounding, can feel dizzy. Occurs at rest or with exertion. Can get get diaphoretic. No SOB no chest pain. Lasts just a few seconds. Occurs several times a day. Can awake from sleep. No specific trigger - coffee x 2cups, no tea, no sodas, no energy drinks, no EtOH. Drinks 1 bottle of water. - she reports atenolol was decreased to 25 mg daily, orthostatic in clinic. During recent ER visit increased back to 37m daily. Symptopms have not imrpoved - TSH 1.167, K 3.3, Cr 0.63  - 04/2016 normal heart monitor - 10/2018 heart monitor rare supraventriuclar and ventricular ectopy. Rare short runs atach. Min HR 54, Max HR 109, Avg HR 68  - mild palpitations since last visit   2. CAD? - CAD is mentioned in her PMH. She unclear of this diagnosis - [facility] cath[facility] reports she was told to be normal - no recent chest pain.    3. Dizzienss - 10/2018 heart monitor rare supraventriuclar and ventricular ectopy. Rare short runs atach. Min HR 54, Max HR 109, Avg HR 68  - dizziness with standing at times.  - can occur with sitting or standing. But most often with standing.  - reports adequate hydration.    4 polyneuraligia,  she is on prednisone.  - prednisone but stable dose.   SH: her husband is Richard [employer] also a patient of mine.    Past Medical History:  Diagnosis Date  . Anxiety   . Asthma    asthmatic bronchitis  . Coronary artery disease   . Dementia    3rd stage dementia  . Depression   . Diabetes mellitus [place] complication ([place]   . Dysrhythmia   . Fibromyalgia   . GERD (gastroesophageal reflux disease)   . Glaucoma   . Hypertension   . Hypothyroidism   . IBS (irritable bowel syndrome)   . Rheumatic  fever   . Stroke ([facility]  left sided weakness; memorey loss  . Thyroid disease      Allergies  Allergen Reactions  . Contrast Media [Iodinated Diagnostic Agents] Nausea And Vomiting and Other (See Comments)    Increased heart rate and sweating "a long time ago."  01/25/14 pt states she "went out" after receiving iv contrast 25 yrs ago and [facility]  . Aspirin Other (See Comments)    Bleeding precautions secondary to ulcerative colitis.  . Codeine Nausea And Vomiting  . Morphine And Related Nausea And Vomiting  . Sulfa Antibiotics Nausea And Vomiting     Current Outpatient Medications  Medication Sig Dispense Refill  . atenolol (TENORMIN) 25 MG tablet Take 25 mg by mouth daily.    . cyclobenzaprine (FLEXERIL) 10 MG tablet Take 10 mg by mouth 3 (three) times daily as needed for muscle spasms.    . diphenhydramine-acetaminophen (TYLENOL PM) 25-500 MG TABS tablet Take 1 tablet by mouth at bedtime.     .Marland Kitchenestradiol (ESTRACE) 0.5 MG tablet Take 0.5 mg by mouth at bedtime.     . gabapentin (NEURONTIN) 600 MG tablet Take 600 mg by mouth 3 (three) times daily.    .Marland KitchenHYDROcodone-acetaminophen (NORCO/VICODIN) 5-325 MG tablet Take 1 tablet  by mouth every 6 (six) hours as needed for moderate pain.    Marland Kitchen latanoprost (XALATAN) 0.005 % ophthalmic solution Place 1 drop into both eyes at bedtime.    Marland Kitchen levothyroxine (SYNTHROID, LEVOTHROID) 100 MCG tablet Take 100 mcg by mouth daily before breakfast.    . Multiple Vitamins-Minerals (MULTIVITAMIN WITH MINERALS) tablet Take 1 tablet by mouth daily.    Marland Kitchen omeprazole (PRILOSEC) 20 MG capsule Take 20 mg by mouth daily.    Marland Kitchen OVER THE COUNTER MEDICATION Apply 1 application topically 2 (two) times daily as needed (leg pain). CBD Oil    . predniSONE (DELTASONE) 50 MG tablet Take 50 mg by mouth every 6 (six) hours. 13 hours, 7 hours, and 1 hour prior to procedure on 07/01/17.    Marland Kitchen venlafaxine (EFFEXOR) 37.5 MG  tablet Take 18.75 mg by mouth daily.   1   No current facility-administered medications for this visit.   Facility-Administered Medications Ordered in Other Visits  Medication Dose Route Frequency Provider Last Rate Last Admin  . ondansetron (ZOFRAN) 4 mg in sodium chloride 0.9 % 50 mL IVPB  4 mg Intravenous Q6H PRN Ashok Pall, MD         Past Surgical History:  Procedure Laterality Date  . ABDOMINAL HYSTERECTOMY    . CATARACT EXTRACTION W/PHACO Right 10/15/2016   Procedure: CATARACT EXTRACTION PHACO AND INTRAOCULAR LENS PLACEMENT (IOC);  Surgeon: Rutherford Guys, MD;  Location: [facility];  Service: Ophthalmology;  Laterality: Right;  CDE: 7.56  . CATARACT EXTRACTION W/PHACO Left 01/14/2017   Procedure: CATARACT EXTRACTION PHACO AND INTRAOCULAR LENS PLACEMENT (IOC);  Surgeon: Rutherford Guys, MD;  Location: [facility];  Service: Ophthalmology;  Laterality: Left;  CDE: 6.28  . CHOLECYSTECTOMY    . ELBOW DEBRIDEMENT Right   . THYROID SURGERY     partial thyroidectomy age 25.     Allergies  Allergen Reactions  . Contrast Media [Iodinated Diagnostic Agents] Nausea And Vomiting and Other (See Comments)    Increased heart rate and sweating "a long time ago."  01/25/14 pt states she "went out" after receiving iv contrast 25 yrs ago and was [facility] to never have it again  . Aspirin Other (See Comments)    Bleeding precautions secondary to ulcerative colitis.  . Codeine Nausea And Vomiting  . Morphine And Related Nausea And Vomiting  . Sulfa Antibiotics Nausea And Vomiting      Family History  Problem Relation Age of Onset  . Cancer Mother   . Stroke Father   . Addison's disease Sister   . Alzheimer's disease Brother      Social History Ms. Huertas reports that she quit smoking about 29 years ago. Her smoking use included cigarettes. She has never used smokeless tobacco. Ms. Lascala reports no history of alcohol use.   Review of Systems CONSTITUTIONAL: No weight loss,  fever, chills, weakness or fatigue.  HEENT: Eyes: No visual loss, blurred vision, double vision or yellow sclerae.No hearing loss, sneezing, congestion, runny nose or sore throat.  SKIN: No rash or itching.  CARDIOVASCULAR: per hpi RESPIRATORY: No shortness of breath, cough or sputum.  GASTROINTESTINAL: No anorexia, nausea, vomiting or diarrhea. No abdominal pain or blood.  GENITOURINARY: No burning on urination, no polyuria NEUROLOGICAL: per hpi MUSCULOSKELETAL: No muscle, back pain, joint pain or stiffness.  LYMPHATICS: No enlarged nodes. No history of splenectomy.  PSYCHIATRIC: No history of depression or anxiety.  ENDOCRINOLOGIC: No reports of sweating, cold or heat intolerance. No polyuria or polydipsia.  Marland Kitchen  Physical Examination Today's Vitals   03/02/19 1424  BP: 110/65  Pulse: 68  SpO2: 98%  Weight: 134 lb 3.2 oz (60.9 kg)  Height: 5' 4"  (1.626 m)   Body mass index is 23.04 kg/m.  Gen: resting comfortably, no acute distress HEENT: no scleral icterus, pupils equal round and reactive, no palptable cervical adenopathy,  CV: RRR, no m/r/g, no jvd Resp: Clear to auscultation bilaterally GI: abdomen is soft, non-tender, non-distended, normal bowel sounds, no hepatosplenomegaly MSK: extremities are warm, no edema.  Skin: warm, no rash Neuro:  no focal deficits Psych: appropriate affect   Diagnostic Studies 04/2016 heart monitor  Telemetry tracings show normal sinus rhythm  No symptoms reported  No significant arrhythmias    Assessment and Plan  1. Palpitations - essentially benign ectopy on recent monitor, overall mild symptoms. Continue atenolol  2. Dizzines - recent monitor without any significant bradyarrhthmias - primarily orthostatic symptoms. Dizzy just standing to get to exam table in clinic, dizzy with orthostatic vitals today.  - orthostatics today show 38 point drop in SBP with standing. Has previously been orthostatic in clinic - reports adequate  hydration. She will increase sodium intake, we will start midodrine 2.60m bid.   F/u 2 months    JArnoldo Lenis M.D

## 2019-03-02 NOTE — Patient Instructions (Signed)
Your physician recommends that you schedule a follow-up appointment in: 2 [place] has recommended you make the following change in your medication:   START MIDODRINE 2.5 MG TWICE DAILY   Thank you for choosing [facility]!!

## 2019-03-05 DIAGNOSIS — K219 Gastro-esophageal reflux disease without esophagitis: Secondary | ICD-10-CM | POA: Diagnosis not present

## 2019-03-05 DIAGNOSIS — I10 Essential (primary) hypertension: Secondary | ICD-10-CM | POA: Diagnosis not present

## 2019-03-09 DIAGNOSIS — H401131 Primary open-angle glaucoma, bilateral, mild stage: Secondary | ICD-10-CM | POA: Diagnosis not present

## 2019-03-23 ENCOUNTER — Encounter: Payer: Self-pay | Admitting: Gastroenterology

## 2019-03-24 DIAGNOSIS — M353 Polymyalgia rheumatica: Secondary | ICD-10-CM | POA: Diagnosis not present

## 2019-03-24 DIAGNOSIS — M2550 Pain in unspecified joint: Secondary | ICD-10-CM | POA: Diagnosis not present

## 2019-03-24 DIAGNOSIS — R634 Abnormal weight loss: Secondary | ICD-10-CM | POA: Diagnosis not present

## 2019-03-24 DIAGNOSIS — Z6823 Body mass index (BMI) 23.0-23.9, adult: Secondary | ICD-10-CM | POA: Diagnosis not present

## 2019-03-24 DIAGNOSIS — M797 Fibromyalgia: Secondary | ICD-10-CM | POA: Diagnosis not present

## 2019-03-24 DIAGNOSIS — R5383 Other fatigue: Secondary | ICD-10-CM | POA: Diagnosis not present

## 2019-03-24 DIAGNOSIS — Z7952 Long term (current) use of systemic steroids: Secondary | ICD-10-CM | POA: Diagnosis not present

## 2019-04-02 DIAGNOSIS — E7849 Other hyperlipidemia: Secondary | ICD-10-CM | POA: Diagnosis not present

## 2019-04-02 DIAGNOSIS — I10 Essential (primary) hypertension: Secondary | ICD-10-CM | POA: Diagnosis not present

## 2019-04-06 ENCOUNTER — Other Ambulatory Visit: Payer: Self-pay

## 2019-04-06 ENCOUNTER — Ambulatory Visit: Payer: PPO | Admitting: Gastroenterology

## 2019-04-06 ENCOUNTER — Encounter: Payer: Self-pay | Admitting: Gastroenterology

## 2019-04-06 DIAGNOSIS — Z860100 Personal history of colon polyps, unspecified: Secondary | ICD-10-CM | POA: Insufficient documentation

## 2019-04-06 DIAGNOSIS — R198 Other specified symptoms and signs involving the digestive system and abdomen: Secondary | ICD-10-CM | POA: Insufficient documentation

## 2019-04-06 MED ORDER — PEG 3350-KCL-NA BICARB-NACL 420 G PO SOLR: 4000.0000 mL | ORAL | 0 refills | Status: DC

## 2019-04-06 NOTE — Progress Notes (Signed)
Primary Care Physician:  Caryl Bis, MD Primary Gastroenterologist:  Dr. Gala Romney  Chief Complaint  Patient presents with  . Colonoscopy    consult  . Diarrhea    HPI:   Tina Schultz is a 74 y.o. female presenting today at the request of Dr. Quillian Quince for evaluation prior to surveillance colonoscopy due to reported diarrhea and need for Propofol.  Patient reports she had a history of 20+ polyps in remote past, with last colonoscopy 2013 by Dr. Britta Mccreedy. She also reports a history of UC, which I am unable to verify. She is unable to give much detail regarding this. No chronic meds for UC. Reviewed last colonoscopy in 2013: mild left-sided diverticulosis, multiple biopsies with path unremarkable.   She notes intermittent constipation and diarrhea. Small pieces at a time. Sporadic. Will have episodes of fecal incontinence. Intermittent loose stool after spans without BM. No rectal bleeding. Notes every 3-4 days will have looser stool. Eats a fiber bar twice a week. No daily fiber. No abdominal pain. States she is a picky eater. No N/V. Chronic GERD on omeprazole daily.   She does report a history of dementia but is fully aware of person, place, time, situation, and able to provide history today.    Past Medical History:  Diagnosis Date  . Anxiety   . Asthma    asthmatic bronchitis  . Coronary artery disease   . Dementia ([place])    3rd stage dementia  . Depression   . Diabetes mellitus without complication ([place])   . Dysrhythmia   . Fibromyalgia   . GERD (gastroesophageal reflux disease)   . Glaucoma   . Hypertension   . Hypothyroidism   . IBS (irritable bowel syndrome)   . Polymyalgia rheumatica ([place])   . Rheumatic fever   . Stroke [facility] [place]   left sided weakness; memorey loss  . Thyroid disease   . Ulcerative colitis ([place])    per patient diagnosed at age 35    Past Surgical History:  Procedure Laterality Date  . ABDOMINAL HYSTERECTOMY    . CATARACT EXTRACTION  W/PHACO Right 10/15/2016   Procedure: CATARACT EXTRACTION PHACO AND INTRAOCULAR LENS PLACEMENT (IOC);  Surgeon: Rutherford Guys, MD;  Location: [facility];  Service: Ophthalmology;  Laterality: Right;  CDE: 7.56  . CATARACT EXTRACTION W/PHACO Left 01/14/2017   Procedure: CATARACT EXTRACTION PHACO AND INTRAOCULAR LENS PLACEMENT (IOC);  Surgeon: Rutherford Guys, MD;  Location: [facility];  Service: Ophthalmology;  Laterality: Left;  CDE: 6.28  . CHOLECYSTECTOMY    . COLONOSCOPY  2013   Dr. Britta Mccreedy: mild left-sided diverticulosis, multiple biopsies with path unremarkable.   . ELBOW DEBRIDEMENT Right   . THYROID SURGERY     partial thyroidectomy age 45.    Current Outpatient Medications  Medication Sig Dispense Refill  . atenolol (TENORMIN) 25 MG tablet Take 25 mg by mouth daily.    . cycloSPORINE (RESTASIS) 0.05 % ophthalmic emulsion Place 1 drop into both eyes daily.    . diphenhydramine-acetaminophen (TYLENOL PM) 25-500 MG TABS tablet Take 1 tablet by mouth at bedtime.     Marland Kitchen estradiol (ESTRACE) 0.5 MG tablet Take 0.5 mg by mouth at bedtime.     . gabapentin (NEURONTIN) 600 MG tablet Take 600 mg by mouth 2 (two) times daily.     Marland Kitchen latanoprost (XALATAN) 0.005 % ophthalmic solution Place 1 drop into both eyes at bedtime.    Marland Kitchen levothyroxine (SYNTHROID) 88 MCG tablet Take 88 mcg by  mouth daily before breakfast.    . midodrine (PROAMATINE) 2.5 MG tablet Take 1 tablet (2.5 mg total) by mouth 2 (two) times daily with a meal. 60 tablet 3  . Multiple Vitamins-Minerals (MULTIVITAMIN WITH MINERALS) tablet Take 1 tablet by mouth daily.    Marland Kitchen omeprazole (PRILOSEC) 20 MG capsule Take 20 mg by mouth daily.    . predniSONE (DELTASONE) 5 MG tablet Take 5 mg by mouth every morning.    . venlafaxine (EFFEXOR) 75 MG tablet Take 75 mg by mouth daily.    . polyethylene glycol-electrolytes (TRILYTE) 420 g solution Take 4,000 mLs by mouth as directed. 4000 mL 0   No current facility-administered medications for this visit.    Facility-Administered Medications Ordered in Other Visits  Medication Dose Route Frequency Provider Last Rate Last Admin  . ondansetron (ZOFRAN) 4 mg in sodium chloride 0.9 % 50 mL IVPB  4 mg Intravenous Q6H PRN Ashok Pall, MD        Allergies as of 04/06/2019 - Review Complete 04/06/2019  Allergen Reaction Noted  . Contrast media [iodinated diagnostic agents] Nausea And Vomiting and Other (See Comments) 03/15/2013  . Aspirin Other (See Comments) 03/15/2013  . Codeine Nausea And Vomiting 03/15/2013  . Morphine and related Nausea And Vomiting 03/15/2013  . Sulfa antibiotics Nausea And Vomiting 03/15/2013    Family History  Problem Relation Age of Onset  . Cancer Mother   . Stroke Father   . Addison's disease Sister   . Alzheimer's disease Brother   . Colon cancer Niece        deceased at 69     Social History   Socioeconomic History  . Marital status: Married    Spouse name: Not on file  . Number of children: Not on file  . Years of education: Not on file  . Highest education level: Not on file  Occupational History  . Occupation: retired    Comment: [employer], cook  Tobacco Use  . Smoking status: Former Smoker    Types: Cigarettes    Quit date: 12/08/1989    Years since quitting: 29.3  . Smokeless tobacco: Never Used  Substance and Sexual Activity  . Alcohol use: No  . Drug use: No  . Sexual activity: Never  Other Topics Concern  . Not on file  Social History Narrative  . Not on file   Social Determinants of Health   Financial Resource Strain:   . Difficulty of Paying Living Expenses: Not on file  Food Insecurity:   . Worried About Charity fundraiser in the Last Year: Not on file  . Ran Out of Food in the Last Year: Not on file  Transportation Schultz:   . Lack of Transportation (Medical): Not on file  . Lack of Transportation (Non-Medical): Not on file  Physical Activity:   . Days of Exercise per Week: Not on file  . Minutes of Exercise per  Session: Not on file  Stress:   . Feeling of Stress : Not on file  Social Connections:   . Frequency of Communication with Friends and Family: Not on file  . Frequency of Social Gatherings with Friends and Family: Not on file  . Attends Religious Services: Not on file  . Active Member of Clubs or Organizations: Not on file  . Attends Archivist Meetings: Not on file  . Marital Status: Not on file  Intimate Partner Violence:   . Fear of Current or Ex-Partner: Not on  file  . Emotionally Abused: Not on file  . Physically Abused: Not on file  . Sexually Abused: Not on file    Review of Systems: Gen: Denies any fever, chills, fatigue, weight loss, lack of appetite.  CV: Denies chest pain, heart palpitations, peripheral edema, syncope.  Resp: Denies shortness of breath at rest or with exertion. Denies wheezing or cough.  GI: see HPI GU : Denies urinary burning, urinary frequency, urinary hesitancy MS: Denies joint pain, muscle weakness, cramps, or limitation of movement.  Derm: Denies rash, itching, dry skin Psych: Denies depression, anxiety, memory loss, and confusion Heme: Denies bruising, bleeding, and enlarged lymph nodes.  Physical Exam: BP (!) 132/57   Pulse (!) 57   Temp (!) 96.9 F (36.1 C) (Temporal)   Ht 5' 4"  (1.626 m)   Wt 135 lb 3.2 oz (61.3 kg)   BMI 23.21 kg/m  General:   Alert and oriented. Pleasant and cooperative. Well-nourished and well-developed.  Head:  Normocephalic and atraumatic. Eyes:  Without icterus, sclera clear and conjunctiva pink.  Ears:  Normal auditory acuity. Lungs:  Clear to auscultation bilaterally. No wheezes, rales, or rhonchi. No distress.  Heart:  S1, S2 present without murmurs appreciated.  Abdomen:  +BS, soft, non-tender and non-distended. No HSM noted. No guarding or rebound. No masses appreciated.  Rectal:  Deferred  Msk:  Symmetrical without gross deformities. Normal posture. Extremities:  Without edema. Neurologic:   Alert and  oriented x4;  grossly normal neurologically. Psych:  Alert and cooperative. Normal mood and affect.  ASSESSMENT: KENZEY BIRKLAND is a 74 y.o. female presenting today with a reported history of numerous polyps in remote past (patient reporting 20+ but no reports available), possible history of UC but unable to verify this, and last colonoscopy in 2013 by Dr. Britta Mccreedy with diverticulosis and unremarkable colonic biopsies. No evidence for IBD on biopsies. At this point, Schultz surveillance colonoscopy due to history of polyps. Alternating constipation and diarrhea reported by patient; we will try to maximize bowel regimen. Although she feels diarrhea is more predominant, from her description she seems to go through spans of days without BM, then has looser stool. I suspect she actually may be dealing with predominantly constipation and could even have some overlow with reported incontinence with diarrheal episodes.    PLAN:  Proceed with TCS with Dr. Gala Romney in near future: the risks, benefits, and alternatives have been discussed with the patient in detail. The patient states understanding and desires to proceed. Propofol due to polypharmacy  Start Benefiber daily and increase as tolerated  Further recommendations to follow   Tina Needs, PhD, ANP-BC [facility] Gastroenterology

## 2019-04-06 NOTE — Patient Instructions (Signed)
We have arranged a colonoscopy with Dr. Gala Romney in the near future.  I recommend trial of Benefiber 2 teaspoons each day, increasing as tolerated. Let me know if this does not help to even out your bowel regimen.  It was a pleasure to see you today. I want to create trusting relationships with patients to provide genuine, compassionate, and quality care. I value your feedback. If you receive a survey regarding your visit,  I greatly appreciate you taking time to fill this out.   Annitta Needs, PhD, ANP-BC [facility] Gastroenterology

## 2019-04-07 ENCOUNTER — Encounter: Payer: Self-pay | Admitting: Gastroenterology

## 2019-04-09 DIAGNOSIS — E1165 Type 2 diabetes mellitus with hyperglycemia: Secondary | ICD-10-CM | POA: Diagnosis not present

## 2019-04-09 DIAGNOSIS — Z6823 Body mass index (BMI) 23.0-23.9, adult: Secondary | ICD-10-CM | POA: Diagnosis not present

## 2019-04-09 DIAGNOSIS — E039 Hypothyroidism, unspecified: Secondary | ICD-10-CM | POA: Diagnosis not present

## 2019-04-09 DIAGNOSIS — M353 Polymyalgia rheumatica: Secondary | ICD-10-CM | POA: Diagnosis not present

## 2019-04-09 DIAGNOSIS — E1169 Type 2 diabetes mellitus with other specified complication: Secondary | ICD-10-CM | POA: Diagnosis not present

## 2019-04-09 DIAGNOSIS — K5190 Ulcerative colitis, unspecified, without complications: Secondary | ICD-10-CM | POA: Diagnosis not present

## 2019-04-09 DIAGNOSIS — I10 Essential (primary) hypertension: Secondary | ICD-10-CM | POA: Diagnosis not present

## 2019-04-09 DIAGNOSIS — E8881 Metabolic syndrome: Secondary | ICD-10-CM | POA: Diagnosis not present

## 2019-04-22 ENCOUNTER — Other Ambulatory Visit: Payer: PPO

## 2019-04-26 DIAGNOSIS — M4800 Spinal stenosis, site unspecified: Secondary | ICD-10-CM | POA: Diagnosis not present

## 2019-04-26 DIAGNOSIS — Z6823 Body mass index (BMI) 23.0-23.9, adult: Secondary | ICD-10-CM | POA: Diagnosis not present

## 2019-04-26 DIAGNOSIS — M5136 Other intervertebral disc degeneration, lumbar region: Secondary | ICD-10-CM | POA: Diagnosis not present

## 2019-05-04 ENCOUNTER — Other Ambulatory Visit: Payer: Self-pay

## 2019-05-04 ENCOUNTER — Ambulatory Visit (INDEPENDENT_AMBULATORY_CARE_PROVIDER_SITE_OTHER): Payer: PPO | Admitting: Cardiology

## 2019-05-04 ENCOUNTER — Encounter: Payer: Self-pay | Admitting: Cardiology

## 2019-05-04 VITALS — BP 122/54 | HR 63 | Ht 64.0 in | Wt 139.0 lb

## 2019-05-04 DIAGNOSIS — I951 Orthostatic hypotension: Secondary | ICD-10-CM | POA: Diagnosis not present

## 2019-05-04 DIAGNOSIS — R002 Palpitations: Secondary | ICD-10-CM

## 2019-05-04 NOTE — Patient Instructions (Signed)

## 2019-05-04 NOTE — Progress Notes (Signed)
Clinical Summary Tina Schultz is a 74 y.o.female seen today for follow up of the following medical problems.   1. Palpitations - seen in ER 03/28/16 with palpitations - symptoms started 8 weeks ago. Feeling of heart pounding, can feel dizzy. Occurs at rest or with exertion. Can get get diaphoretic. No SOB no chest pain. Lasts just a few seconds. Occurs several times a day. Can awake from sleep. No specific trigger - coffee x 2cups, no tea, no sodas, no energy drinks, no EtOH. Drinks 1 bottle of water. - she reports atenolol was decreased to 25 mg daily, orthostatic in clinic. During recent ER visit increased back to 42m daily. Symptopms have not imrpoved - TSH 1.167, K 3.3, Cr 0.63  - 04/2016 normal heart monitor - 10/2018 heart monitor rare supraventriuclar and ventricular ectopy. Rare short runs atach. Min HR 54, Max HR 109, Avg HR 68  - occasional palpitaitons since last visit, mild and short in duration.    2. CAD? - CAD is mentioned in her PMH. She unclear of this diagnosis - [facility] for heart cathshe reports she was told to be normal - no symptoms   3. Orthostatic hypotension - 10/2018 heart monitor rare supraventriuclar and ventricular ectopy. Rare short runs atach. Min HR 54, Max HR 109, Avg HR 68  - last clinic visit 38 point drop in SBP with standing.  - last visit we started midodrine 2.568mtid, however appears Rx put in for only bid - symptoms have improved, not resolved but at a level they are tolerable.    4 polyneuraligia,  she is on prednisone.  - prednisone but stable dose.   SH: her husband is Richard DeField seismologistalso a patient of mine Past Medical History:  Diagnosis Date  . Anxiety   . Asthma    asthmatic bronchitis  . Coronary artery disease   . Dementia ([facility]   3rd stage dementia  . Depression   . Diabetes mellitus without complication ([facility]  . Dysrhythmia   . Fibromyalgia   . GERD (gastroesophageal  reflux disease)   . Glaucoma   . Hypertension   . Hypothyroidism   . IBS (irritable bowel syndrome)   . Polymyalgia rheumatica ([facility]  . Rheumatic fever   . St[facility] ([facility] left sided weakness; memorey loss  . Thyroid disease   . [place]ative colitis ([facility]   per patient diagnosed at age 74   Allergies  Allergen Reactions  . Contrast Media [Iodinated Diagnostic Agents] Nausea And Vomiting and Other (See Comments)    Increased heart rate and sweating "a long time ago."  01/25/14 pt states she "went out" after receiving iv contrast 25 yrs ago and [facility]  . Aspirin Other (See Comments)    Bleeding precautions secondary to ulcerative colitis.  . Codeine Nausea And Vomiting  . Morphine And Related Nausea And Vomiting  . Sulfa Antibiotics Nausea And Vomiting     Current Outpatient Medications  Medication Sig Dispense Refill  . atenolol (TENORMIN) 25 MG tablet Take 25 mg by mouth daily.    . cycloSPORINE (RESTASIS) 0.05 % ophthalmic emulsion Place 1 drop into both eyes daily.    . diphenhydramine-acetaminophen (TYLENOL PM) 25-500 MG TABS tablet Take 1 tablet by mouth at bedtime.     . Marland Kitchenstradiol (ESTRACE) 0.5 MG tablet Take 0.5 mg by mouth at bedtime.     .Marland Kitchen  gabapentin (NEURONTIN) 600 MG tablet Take 600 mg by mouth 2 (two) times daily.     Marland Kitchen latanoprost (XALATAN) 0.005 % ophthalmic solution Place 1 drop into both eyes at bedtime.    Marland Kitchen levothyroxine (SYNTHROID) 88 MCG tablet Take 88 mcg by mouth daily before breakfast.    . midodrine (PROAMATINE) 2.5 MG tablet Take 1 tablet (2.5 mg total) by mouth 2 (two) times daily with a meal. 60 tablet 3  . Multiple Vitamins-Minerals (MULTIVITAMIN WITH MINERALS) tablet Take 1 tablet by mouth daily.    Marland Kitchen omeprazole (PRILOSEC) 20 MG capsule Take 20 mg by mouth daily.    . polyethylene glycol-electrolytes (TRILYTE) 420 g solution Take 4,000 mLs by mouth as directed. 4000 mL 0  . predniSONE  (DELTASONE) 5 MG tablet Take 5 mg by mouth every morning.    . venlafaxine (EFFEXOR) 75 MG tablet Take 75 mg by mouth daily.     No current facility-administered medications for this visit.   Facility-Administered Medications Ordered in Other Visits  Medication Dose Route Frequency Provider Last Rate Last Admin  . ondansetron (ZOFRAN) 4 mg in sodium chloride 0.9 % 50 mL IVPB  4 mg Intravenous Q6H PRN Ashok Pall, MD         Past Surgical History:  Procedure Laterality Date  . ABDOMINAL HYSTERECTOMY    . CATARACT EXTRACTION W/PHACO Right 10/15/2016   Procedure: CATARACT EXTRACTION PHACO AND INTRAOCULAR LENS PLACEMENT (IOC);  Surgeon: Rutherford Guys, MD;  Location: [facility];  Service: Ophthalmology;  Laterality: Right;  CDE: 7.56  . CATARACT EXTRACTION W/PHACO Left 01/14/2017   Procedure: CATARACT EXTRACTION PHACO AND INTRAOCULAR LENS PLACEMENT (IOC);  Surgeon: Rutherford Guys, MD;  Location: [facility];  Service: Ophthalmology;  Laterality: Left;  CDE: 6.28  . CHOLECYSTECTOMY    . COLONOSCOPY  2013   Dr. Britta Mccreedy: mild left-sided diverticulosis, multiple biopsies with path unremarkable.   . ELBOW DEBRIDEMENT Right   . THYROID SURGERY     partial thyroidectomy age 46.     Allergies  Allergen Reactions  . Contrast Media [Iodinated Diagnostic Agents] Nausea And Vomiting and Other (See Comments)    Increased heart rate and sweating "a long time ago."  01/25/14 pt states she "went out" after receiving iv contrast 25 yrs ago and was [facility] by [facility] to never have it again  . Aspirin Other (See Comments)    Bleeding precautions secondary to ulcerative colitis.  . Codeine Nausea And Vomiting  . Morphine And Related Nausea And Vomiting  . Sulfa Antibiotics Nausea And Vomiting      Family History  Problem Relation Age of Onset  . Cancer Mother   . Stroke Father   . Addison's disease Sister   . Alzheimer's disease Brother   . Colon cancer Niece        deceased at 10      Social  History Ms. Franklin reports that she quit smoking about 29 years ago. Her smoking use included cigarettes. She has never used smokeless tobacco. Ms. Soule reports no history of alcohol use.   Review of Systems CONSTITUTIONAL: No weight loss, fever, chills, weakness or fatigue.  HEENT: Eyes: No visual loss, blurred vision, double vision or yellow sclerae.No hearing loss, sneezing, congestion, runny nose or sore throat.  SKIN: No rash or itching.  CARDIOVASCULAR: per hpi RESPIRATORY: No shortness of breath, cough or sputum.  GASTROINTESTINAL: No anorexia, nausea, vomiting or diarrhea. No abdominal pain or blood.  GENITOURINARY: No burning on urination, no polyuria  NEUROLOGICAL: +dizziness MUSCULOSKELETAL: No muscle, back pain, joint pain or stiffness.  LYMPHATICS: No enlarged nodes. No history of splenectomy.  PSYCHIATRIC: No history of depression or anxiety.  ENDOCRINOLOGIC: No reports of sweating, cold or heat intolerance. No polyuria or polydipsia.  Marland Kitchen   Physical Examination Today's Vitals   05/04/19 1259  BP: (!) 122/54  Pulse: 63  SpO2: 96%  Weight: 139 lb (63 kg)  Height: 5' 4"  (1.626 m)   Body mass index is 23.86 kg/m.  Gen: resting comfortably, no acute distress HEENT: no scleral icterus, pupils equal round and reactive, no palptable cervical adenopathy,  CV: RRR, no m/r/g, no jvd Resp: Clear to auscultation bilaterally GI: abdomen is soft, non-tender, non-distended, normal bowel sounds, no hepatosplenomegaly MSK: extremities are warm, no edema.  Skin: warm, no rash Neuro:  no focal deficits Psych: appropriate affect   Diagnostic Studies  04/2016 heart monitor  Telemetry tracings show normal sinus rhythm  No symptoms reported  No significant arrhythmias    Assessment and Plan  1. Palpitations -essentially benign ectopy on event monitor - mild infrequent symptoms, continue atenolol. WOuld be cautious with any future titration given her orthostatic  hypotension  2.Orthostatic hypotension - continue midodrine and aggressive hydration, symptoms fairly well controlled.    F/u 1 year       Arnoldo Lenis, M.D

## 2019-05-05 DIAGNOSIS — I10 Essential (primary) hypertension: Secondary | ICD-10-CM | POA: Diagnosis not present

## 2019-05-05 DIAGNOSIS — E7849 Other hyperlipidemia: Secondary | ICD-10-CM | POA: Diagnosis not present

## 2019-05-14 DIAGNOSIS — M48062 Spinal stenosis, lumbar region with neurogenic claudication: Secondary | ICD-10-CM | POA: Diagnosis not present

## 2019-05-26 ENCOUNTER — Telehealth: Payer: Self-pay | Admitting: *Deleted

## 2019-05-26 ENCOUNTER — Other Ambulatory Visit: Payer: Self-pay | Admitting: Cardiology

## 2019-05-26 ENCOUNTER — Encounter: Payer: Self-pay | Admitting: *Deleted

## 2019-05-26 MED ORDER — NA SULFATE-K SULFATE-MG SULF 17.5-3.13-1.6 GM/177ML PO SOLN: 1.0000 | Freq: Once | ORAL | 0 refills | Status: AC

## 2019-05-26 NOTE — Telephone Encounter (Signed)
Patient called in stating prep on back order. I have sent in suprep and I have faxed prep instructions to pharmacy. Patient aware.

## 2019-05-27 ENCOUNTER — Telehealth: Payer: Self-pay | Admitting: Internal Medicine

## 2019-05-27 NOTE — Telephone Encounter (Signed)
[phone] PATIENT CALLED AND SAID THE PREP THAT WAS SENT IN WILL COST HER 90$,  PLEASE SEND IN AN ALTERNATIVE IF POSSIBLE.  PLEASE CALL HER

## 2019-05-27 NOTE — Telephone Encounter (Signed)
Miralax instructions placed at front desk. Called and informed pt. She will pick up 05/31/19 when she goes to pre-op/covid test appt.

## 2019-05-28 NOTE — Patient Instructions (Signed)
Tina Schultz  05/28/2019     @PREFPERIOPPHARMACY @   Your procedure is scheduled on  06/03/2019  Report to [facility] at  1230  P.M.  Call this number if you have problems the morning of surgery:  [phone]   Remember:  Follow the diet and prep instructions given to you by Dr Roseanne Kaufman office.                      Take these medicines the morning of surgery with A SIP OF WATER  Atenolol, flexeril(if needed), gabapentin, hydrocodone(if needed), levothyroxine, omeprazole, deltasone, effexor.    Do not wear jewelry, make-up or nail polish.  Do not wear lotions, powders, or perfumes. Please wear deodorant and brush your teeth.  Do not shave 48 hours prior to surgery.  Men may shave face and neck.  Do not bring valuables to the hospital.  [facility] is not responsible for any belongings or valuables.  Contacts, dentures or bridgework may not be worn into surgery.  Leave your suitcase in the car.  After surgery it may be brought to your room.  For patients admitted to the hospital, discharge time will be determined by your treatment team.  Patients discharged the day of surgery will not be allowed to drive home.   Name and phone number of your driver:   family Special instructions:  DO NOT smoke the morning of your procedure.  Please read over the following fact sheets that you were given. Anesthesia Post-op Instructions and Care and Recovery After Surgery       Colonoscopy, Adult, Care After This sheet gives you information about how to care for yourself after your procedure. Your health care provider may also give you more specific instructions. If you have problems or questions, contact your health care provider. What can I expect after the procedure? After the procedure, it is common to have:  A small amount of blood in your stool for 24 hours after the procedure.  Some gas.  Mild cramping or bloating of your abdomen. Follow these instructions at  home: Eating and drinking   Drink enough fluid to keep your urine pale yellow.  Follow instructions from your health care provider about eating or drinking restrictions.  Resume your normal diet as instructed by your health care provider. Avoid heavy or fried foods that are hard to digest. Activity  Rest as told by your health care provider.  Avoid sitting for a long time without moving. Get up to take short walks every 1-2 hours. This is important to improve blood flow and breathing. Ask for help if you feel weak or unsteady.  Return to your normal activities as told by your health care provider. Ask your health care provider what activities are safe for you. Managing cramping and bloating   Try walking around when you have cramps or feel bloated.  Apply heat to your abdomen as told by your health care provider. Use the heat source that your health care provider recommends, such as a moist heat pack or a heating pad. ? Place a towel between your skin and the heat source. ? Leave the heat on for 20-30 minutes. ? Remove the heat if your skin turns bright red. This is especially important if you are unable to feel pain, heat, or cold. You may have a greater risk of getting burned. General instructions  For the first 24 hours after the procedure: ? Do  not drive or use machinery. ? Do not sign important documents. ? Do not drink alcohol. ? Do your regular daily activities at a slower pace than normal. ? Eat soft foods that are easy to digest.  Take over-the-counter and prescription medicines only as told by your health care provider.  Keep all follow-up visits as told by your health care provider. This is important. Contact a health care provider if:  You have blood in your stool 2-3 days after the procedure. Get help right away if you have:  More than a small spotting of blood in your stool.  Large blood clots in your stool.  Swelling of your abdomen.  Nausea or  vomiting.  A fever.  Increasing pain in your abdomen that is not relieved with medicine. Summary  After the procedure, it is common to have a small amount of blood in your stool. You may also have mild cramping and bloating of your abdomen.  For the first 24 hours after the procedure, do not drive or use machinery, sign important documents, or drink alcohol.  Get help right away if you have a lot of blood in your stool, nausea or vomiting, a fever, or increased pain in your abdomen. This information is not intended to replace advice given to you by your health care provider. Make sure you discuss any questions you have with your health care provider. Document Revised: 08/17/2018 Document Reviewed: 08/17/2018 Elsevier Patient Education  [place] After These instructions provide you with information about caring for yourself after your procedure. Your health care provider may also give you more specific instructions. Your treatment has been planned according to current medical practices, but problems sometimes occur. Call your health care provider if you have any problems or questions after your procedure. What can I expect after the procedure? After your procedure, you may:  Feel sleepy for several hours.  Feel clumsy and have poor balance for several hours.  Feel forgetful about what happened after the procedure.  Have poor judgment for several hours.  Feel nauseous or vomit.  Have a sore throat if you had a breathing tube during the procedure. Follow these instructions at home: For at least 24 hours after the procedure:      Have a responsible adult stay with you. It is important to have someone help care for you until you are awake and alert.  Rest as needed.  Do not: ? Participate in activities in which you could fall or become injured. ? Drive. ? Use heavy machinery. ? Drink alcohol. ? Take sleeping pills or medicines that  cause drowsiness. ? Make important decisions or sign legal documents. ? Take care of children on your own. Eating and drinking  Follow the diet that is recommended by your health care provider.  If you vomit, drink water, juice, or soup when you can drink without vomiting.  Make sure you have little or no nausea before eating solid foods. General instructions  Take over-the-counter and prescription medicines only as told by your health care provider.  If you have sleep apnea, surgery and certain medicines can increase your risk for breathing problems. Follow instructions from your health care provider about wearing your sleep device: ? Anytime you are sleeping, including during daytime naps. ? While taking prescription pain medicines, sleeping medicines, or medicines that make you drowsy.  If you smoke, do not smoke without supervision.  Keep all follow-up visits as told by your health care provider.  This is important. Contact a health care provider if:  You keep feeling nauseous or you keep vomiting.  You feel light-headed.  You develop a rash.  You have a fever. Get help right away if:  You have trouble breathing. Summary  For several hours after your procedure, you may feel sleepy and have poor judgment.  Have a responsible adult stay with you for at least 24 hours or until you are awake and alert. This information is not intended to replace advice given to you by your health care provider. Make sure you discuss any questions you have with your health care provider. Document Revised: 04/21/2017 Document Reviewed: 05/14/2015 Elsevier Patient Education  [place].

## 2019-05-31 ENCOUNTER — Encounter (HOSPITAL_COMMUNITY): Payer: Self-pay

## 2019-05-31 ENCOUNTER — Encounter (HOSPITAL_COMMUNITY)
Admission: RE | Admit: 2019-05-31 | Discharge: 2019-05-31 | Disposition: A | Discharge status: Home / Self Care | Payer: PPO | Source: Ambulatory Visit | Attending: Internal Medicine | Admitting: Internal Medicine

## 2019-05-31 ENCOUNTER — Other Ambulatory Visit (HOSPITAL_COMMUNITY)
Admission: RE | Admit: 2019-05-31 | Discharge: 2019-05-31 | Disposition: A | Discharge status: Home / Self Care | Payer: PPO | Source: Ambulatory Visit | Attending: Internal Medicine | Admitting: Internal Medicine

## 2019-05-31 ENCOUNTER — Other Ambulatory Visit: Payer: Self-pay

## 2019-05-31 DIAGNOSIS — Z01812 Encounter for preprocedural laboratory examination: Secondary | ICD-10-CM | POA: Insufficient documentation

## 2019-05-31 DIAGNOSIS — Z20822 Contact with and (suspected) exposure to covid-19: Secondary | ICD-10-CM | POA: Diagnosis not present

## 2019-05-31 LAB — BASIC METABOLIC PANEL
Anion gap: 10 (ref 5–15)
BUN: 20 mg/dL (ref 8–23)
CO2: 26 mmol/L (ref 22–32)
Calcium: 9.2 mg/dL (ref 8.9–10.3)
Chloride: 102 mmol/L (ref 98–111)
Creatinine, Ser: 0.78 mg/dL (ref 0.44–1.00)
GFR calc Af Amer: >60 mL/min (ref >60)
GFR calc non Af Amer: >60 mL/min (ref >60)
Glucose, Bld: 95 mg/dL (ref 70–99)
Potassium: 3.7 mmol/L (ref 3.5–5.1)
Sodium: 138 mmol/L (ref 135–145)

## 2019-05-31 LAB — SARS CORONAVIRUS 2 (TAT 6-24 HRS): SARS Coronavirus 2: NEGATIVE

## 2019-06-03 ENCOUNTER — Ambulatory Visit (HOSPITAL_COMMUNITY): Payer: PPO | Admitting: Certified Registered Nurse Anesthetist

## 2019-06-03 ENCOUNTER — Ambulatory Visit (HOSPITAL_COMMUNITY): Admission: RE | Admit: 2019-06-03 | Payer: PPO | Source: Ambulatory Visit | Admitting: Internal Medicine

## 2019-06-03 ENCOUNTER — Encounter (HOSPITAL_COMMUNITY): Payer: Self-pay | Admitting: Internal Medicine

## 2019-06-03 ENCOUNTER — Encounter (HOSPITAL_COMMUNITY)
Admission: RE | Disposition: A | Discharge status: Home / Self Care | Payer: Self-pay | Source: Home / Self Care | Attending: Internal Medicine

## 2019-06-03 ENCOUNTER — Ambulatory Visit (HOSPITAL_COMMUNITY)
Admission: RE | Admit: 2019-06-03 | Discharge: 2019-06-03 | Disposition: A | Discharge status: Home / Self Care | Payer: PPO | Attending: Internal Medicine | Admitting: Internal Medicine

## 2019-06-03 DIAGNOSIS — E119 Type 2 diabetes mellitus without complications: Secondary | ICD-10-CM | POA: Insufficient documentation

## 2019-06-03 DIAGNOSIS — J45909 Unspecified asthma, uncomplicated: Secondary | ICD-10-CM | POA: Diagnosis not present

## 2019-06-03 DIAGNOSIS — Z91041 Radiographic dye allergy status: Secondary | ICD-10-CM | POA: Insufficient documentation

## 2019-06-03 DIAGNOSIS — K5730 Diverticulosis of large intestine without perforation or abscess without bleeding: Secondary | ICD-10-CM | POA: Diagnosis not present

## 2019-06-03 DIAGNOSIS — I10 Essential (primary) hypertension: Secondary | ICD-10-CM | POA: Insufficient documentation

## 2019-06-03 DIAGNOSIS — Z86010 Personal history of colonic polyps: Secondary | ICD-10-CM | POA: Diagnosis not present

## 2019-06-03 DIAGNOSIS — Z961 Presence of intraocular lens: Secondary | ICD-10-CM | POA: Diagnosis not present

## 2019-06-03 DIAGNOSIS — Z885 Allergy status to narcotic agent status: Secondary | ICD-10-CM | POA: Diagnosis not present

## 2019-06-03 DIAGNOSIS — H409 Unspecified glaucoma: Secondary | ICD-10-CM | POA: Insufficient documentation

## 2019-06-03 DIAGNOSIS — M353 Polymyalgia rheumatica: Secondary | ICD-10-CM | POA: Insufficient documentation

## 2019-06-03 DIAGNOSIS — Z882 Allergy status to sulfonamides status: Secondary | ICD-10-CM | POA: Insufficient documentation

## 2019-06-03 DIAGNOSIS — Z9841 Cataract extraction status, right eye: Secondary | ICD-10-CM | POA: Insufficient documentation

## 2019-06-03 DIAGNOSIS — Z87891 Personal history of nicotine dependence: Secondary | ICD-10-CM | POA: Insufficient documentation

## 2019-06-03 DIAGNOSIS — E890 Postprocedural hypothyroidism: Secondary | ICD-10-CM | POA: Diagnosis not present

## 2019-06-03 DIAGNOSIS — F0390 Unspecified dementia without behavioral disturbance: Secondary | ICD-10-CM | POA: Diagnosis not present

## 2019-06-03 DIAGNOSIS — K529 Noninfective gastroenteritis and colitis, unspecified: Secondary | ICD-10-CM | POA: Insufficient documentation

## 2019-06-03 DIAGNOSIS — K219 Gastro-esophageal reflux disease without esophagitis: Secondary | ICD-10-CM | POA: Insufficient documentation

## 2019-06-03 DIAGNOSIS — Z8673 Personal history of transient ischemic attack (TIA), and cerebral infarction without residual deficits: Secondary | ICD-10-CM | POA: Insufficient documentation

## 2019-06-03 DIAGNOSIS — F329 Major depressive disorder, single episode, unspecified: Secondary | ICD-10-CM | POA: Insufficient documentation

## 2019-06-03 DIAGNOSIS — G709 Myoneural disorder, unspecified: Secondary | ICD-10-CM | POA: Insufficient documentation

## 2019-06-03 DIAGNOSIS — F419 Anxiety disorder, unspecified: Secondary | ICD-10-CM | POA: Insufficient documentation

## 2019-06-03 DIAGNOSIS — I2510 Atherosclerotic heart disease of native coronary artery without angina pectoris: Secondary | ICD-10-CM | POA: Diagnosis not present

## 2019-06-03 DIAGNOSIS — M797 Fibromyalgia: Secondary | ICD-10-CM | POA: Insufficient documentation

## 2019-06-03 DIAGNOSIS — Z79890 Hormone replacement therapy (postmenopausal): Secondary | ICD-10-CM | POA: Insufficient documentation

## 2019-06-03 DIAGNOSIS — Z886 Allergy status to analgesic agent status: Secondary | ICD-10-CM | POA: Insufficient documentation

## 2019-06-03 DIAGNOSIS — Z8719 Personal history of other diseases of the digestive system: Secondary | ICD-10-CM | POA: Insufficient documentation

## 2019-06-03 DIAGNOSIS — Z9842 Cataract extraction status, left eye: Secondary | ICD-10-CM | POA: Insufficient documentation

## 2019-06-03 HISTORY — PX: BIOPSY: SHX5522

## 2019-06-03 HISTORY — PX: COLONOSCOPY WITH PROPOFOL: SHX5780

## 2019-06-03 LAB — GLUCOSE, CAPILLARY
Glucose-Capillary: 117 mg/dL — ABNORMAL HIGH (ref 70–99)
Glucose-Capillary: 101 mg/dL — ABNORMAL HIGH (ref 70–99)

## 2019-06-03 SURGERY — COLONOSCOPY WITH PROPOFOL
Anesthesia: General

## 2019-06-03 MED ORDER — LIDOCAINE 2% (20 MG/ML) 5 ML SYRINGE
INTRAMUSCULAR | Status: AC
  Filled 2019-06-03: qty 5

## 2019-06-03 MED ORDER — PROPOFOL 10 MG/ML IV BOLUS
INTRAVENOUS | Status: AC
  Filled 2019-06-03: qty 100

## 2019-06-03 MED ORDER — PROPOFOL 500 MG/50ML IV EMUL
INTRAVENOUS | Status: DC | PRN
  Administered 2019-06-03: 200 ug/kg/min via INTRAVENOUS

## 2019-06-03 MED ORDER — KETAMINE HCL 50 MG/5ML IJ SOSY
PREFILLED_SYRINGE | INTRAMUSCULAR | Status: AC
  Filled 2019-06-03: qty 5

## 2019-06-03 MED ORDER — GLYCOPYRROLATE PF 0.2 MG/ML IJ SOSY
PREFILLED_SYRINGE | INTRAMUSCULAR | Status: AC
  Filled 2019-06-03: qty 1

## 2019-06-03 MED ORDER — LACTATED RINGERS IV SOLN
Freq: Once | INTRAVENOUS | Status: AC
  Administered 2019-06-03: 1000 mL via INTRAVENOUS

## 2019-06-03 MED ORDER — LACTATED RINGERS IV SOLN
INTRAVENOUS | Status: DC | PRN
Start: 2019-06-03 — End: 2019-06-03

## 2019-06-03 MED ORDER — DEXMEDETOMIDINE HCL IN NACL 200 MCG/50ML IV SOLN
INTRAVENOUS | Status: AC
  Filled 2019-06-03: qty 50

## 2019-06-03 MED ORDER — PROPOFOL 10 MG/ML IV BOLUS
INTRAVENOUS | Status: DC | PRN
  Administered 2019-06-03: 40 mg via INTRAVENOUS
  Administered 2019-06-03: 50 mg via INTRAVENOUS
  Administered 2019-06-03: 10 mg via INTRAVENOUS

## 2019-06-03 MED ORDER — SODIUM CHLORIDE 0.9 % IV SOLN: INTRAVENOUS | Status: DC

## 2019-06-03 NOTE — H&P (Signed)
@LOGO @   Primary Care Physician:  Caryl Bis, MD Primary Gastroenterologist:  Dr. Gala Romney  Pre-Procedure History & Physical: HPI:  Tina Schultz is a 74 y.o. female here for surveillance colonoscopy.  History of colon polyps.  Sketchy history of IBD.  Chronic diarrhea.  Here for surveillance colonoscopy.  Past Medical History:  Diagnosis Date  . Anxiety   . Asthma    asthmatic bronchitis  . Coronary artery disease   . Dementia ([place])    3rd stage dementia  . Depression   . Diabetes mellitus without complication ([place])   . Dysrhythmia   . Fibromyalgia   . GERD (gastroesophageal reflux disease)   . Glaucoma   . Hypertension   . Hypothyroidism   . IBS (irritable bowel syndrome)   . Polymyalgia rheumatica ([place])   . Rheumatic fever   . Stroke [facility] [place]   left sided weakness; memorey loss  . Thyroid disease   . Ulcerative colitis ([place])    per patient diagnosed at age 89    Past Surgical History:  Procedure Laterality Date  . ABDOMINAL HYSTERECTOMY    . CATARACT EXTRACTION W/PHACO Right 10/15/2016   Procedure: CATARACT EXTRACTION PHACO AND INTRAOCULAR LENS PLACEMENT (IOC);  Surgeon: Rutherford Guys, MD;  Location: [facility];  Service: Ophthalmology;  Laterality: Right;  CDE: 7.56  . CATARACT EXTRACTION W/PHACO Left 01/14/2017   Procedure: CATARACT EXTRACTION PHACO AND INTRAOCULAR LENS PLACEMENT (IOC);  Surgeon: Rutherford Guys, MD;  Location: [facility];  Service: Ophthalmology;  Laterality: Left;  CDE: 6.28  . CHOLECYSTECTOMY    . COLONOSCOPY  2013   Dr. Britta Mccreedy: mild left-sided diverticulosis, multiple biopsies with path unremarkable.   . ELBOW DEBRIDEMENT Right   . THYROID SURGERY     partial thyroidectomy age 58.    Prior to Admission medications   Medication Sig Start Date End Date Taking? Authorizing Provider  alendronate (FOSAMAX) 70 MG tablet Take 70 mg by mouth every Wednesday. Take with a full glass of water on an empty stomach.   Yes [provider]   atenolol (TENORMIN) 25 MG tablet Take 25 mg by mouth daily.   Yes [provider]  Camphor-Menthol-Methyl Sal (SALONPAS) 3.02-10-08 % PTCH Apply 1 packet topically daily as needed (Back pain).   Yes [provider]  cyclobenzaprine (FLEXERIL) 10 MG tablet Take 10 mg by mouth 3 (three) times daily as needed for muscle spasms.   Yes [provider]  cycloSPORINE (RESTASIS) 0.05 % ophthalmic emulsion Place 1 drop into both eyes daily.   Yes [provider]  diphenhydramine-acetaminophen (TYLENOL PM) 25-500 MG TABS tablet Take 1 tablet by mouth at bedtime.    Yes [provider]  estradiol (ESTRACE) 0.5 MG tablet Take 0.5 mg by mouth at bedtime.    Yes [provider]  gabapentin (NEURONTIN) 600 MG tablet Take 600 mg by mouth 2 (two) times daily.    Yes [provider]  HYDROcodone-acetaminophen (NORCO/VICODIN) 5-325 MG tablet Take 1 tablet by mouth every 6 (six) hours as needed for moderate pain.   Yes [provider]  latanoprost (XALATAN) 0.005 % ophthalmic solution Place 1 drop into both eyes at bedtime.   Yes [provider]  levothyroxine (SYNTHROID) 88 MCG tablet Take 88 mcg by mouth daily before breakfast.   Yes [provider]  midodrine (PROAMATINE) 2.5 MG tablet TAKE 1 TABLET (2.5 MG TOTAL) BY MOUTH 2 (TWO) TIMES DAILY WITH A MEAL. 05/26/19  Yes Herminio Commons,  MD  Multiple Vitamins-Minerals (MULTIVITAMIN WITH MINERALS) tablet Take 1 tablet by mouth daily.   Yes [provider]  omeprazole (PRILOSEC) 20 MG capsule Take 20 mg by mouth daily.   Yes [provider]  polyethylene glycol-electrolytes (TRILYTE) 420 g solution Take 4,000 mLs by mouth as directed. 04/06/19  Yes Imaad Reuss, Cristopher Estimable, MD  predniSONE (DELTASONE) 5 MG tablet Take 5 mg by mouth daily.    Yes [provider]  venlafaxine (EFFEXOR) 75 MG tablet Take 75 mg by mouth daily.   Yes [provider]     Allergies as of 04/06/2019 - Review Complete 04/06/2019  Allergen Reaction Noted  . Contrast media [iodinated diagnostic agents] Nausea And Vomiting and Other (See Comments) 03/15/2013  . Aspirin Other (See Comments) 03/15/2013  . Codeine Nausea And Vomiting 03/15/2013  . Morphine and related Nausea And Vomiting 03/15/2013  . Sulfa antibiotics Nausea And Vomiting 03/15/2013    Family History  Problem Relation Age of Onset  . Cancer Mother   . Stroke Father   . Addison's disease Sister   . Alzheimer's disease Brother   . Colon cancer Niece        deceased at 9     Social History   Socioeconomic History  . Marital status: Married    Spouse name: Not on file  . Number of children: Not on file  . Years of education: Not on file  . Highest education level: Not on file  Occupational History  . Occupation: retired    Comment: [employer], cook  Tobacco Use  . Smoking status: Former Smoker    Types: Cigarettes    Quit date: 12/08/1989    Years since quitting: 29.5  . Smokeless tobacco: Never Used  Substance and Sexual Activity  . Alcohol use: No  . Drug use: No  . Sexual activity: Never  Other Topics Concern  . Not on file  Social History Narrative  . Not on file   Social Determinants of Health   Financial Resource Strain:   . Difficulty of Paying Living Expenses:   Food Insecurity:   . Worried About Charity fundraiser in the Last Year:   . Arboriculturist in the Last Year:   Transportation Needs:   . Film/video editor (Medical):   Marland Kitchen Lack of Transportation (Non-Medical):   Physical Activity:   . Days of Exercise per Week:   . Minutes of Exercise per Session:   Stress:   . Feeling of Stress :   Social Connections:   . Frequency of Communication with Friends and Family:   . Frequency of Social Gatherings with Friends and Family:   . Attends Religious Services:   . Active Member of Clubs or Organizations:   . Attends Archivist  Meetings:   Marland Kitchen Marital Status:   Intimate Partner Violence:   . Fear of Current or Ex-Partner:   . Emotionally Abused:   Marland Kitchen Physically Abused:   . Sexually Abused:     Review of Systems: See HPI, otherwise negative ROS  Physical Exam: BP (!) 149/84   Pulse 84   Temp 97.9 F (36.6 C) (Oral)   Resp 15   Ht 5' 4"  (1.626 m)   Wt 61.7 kg   SpO2 97%   BMI 23.34 kg/m  General:   Alert,  Well-developed, well-nourished, pleasant and cooperative in NAD Neck:  Supple; no masses or thyromegaly. No significant cervical adenopathy. Lungs:  Clear throughout to  auscultation.   No wheezes, crackles, or rhonchi. No acute distress. Heart:  Regular rate and rhythm; no murmurs, clicks, rubs,  or gallops. Abdomen: Non-distended, normal bowel sounds.  Soft and nontender without appreciable mass or hepatosplenomegaly.  Pulses:  Normal pulses noted. Extremities:  Without clubbing or edema.  Impression/Plan: 74 year old lady with a reported history colonic polyps removed elsewhere.  Also chronic diarrhea.  Sketchy history of inflammatory bowel disease.  I have offered the patient a colonoscopy today.  The risks, benefits, limitations, alternatives and imponderables have been reviewed with the patient. Questions have been answered. All parties are agreeable.      Notice: This dictation was prepared with Dragon dictation along with smaller phrase technology. Any transcriptional errors that result from this process are unintentional and may not be corrected upon review.

## 2019-06-03 NOTE — Discharge Instructions (Signed)
Colonoscopy Discharge Instructions  Read the instructions outlined below and refer to this sheet in the next few weeks. These discharge instructions provide you with general information on caring for yourself after you leave the hospital. Your doctor may also give you specific instructions. While your treatment has been planned according to the most current medical practices available, unavoidable complications occasionally occur. If you have any problems or questions after discharge, call Dr. Gala Romney at [phone]. ACTIVITY  You may resume your regular activity, but move at a slower pace for the next 24 hours.   Take frequent rest periods for the next 24 hours.   Walking will help get rid of the air and reduce the bloated feeling in your belly (abdomen).   No driving for 24 hours (because of the medicine (anesthesia) used during the test).    Do not sign any important legal documents or operate any machinery for 24 hours (because of the anesthesia used during the test).  NUTRITION  Drink plenty of fluids.   You may resume your normal diet as instructed by your doctor.   Begin with a light meal and progress to your normal diet. Heavy or fried foods are harder to digest and may make you feel sick to your stomach (nauseated).   Avoid alcoholic beverages for 24 hours or as instructed.  MEDICATIONS  You may resume your normal medications unless your doctor tells you otherwise.  WHAT YOU CAN EXPECT TODAY  Some feelings of bloating in the abdomen.   Passage of more gas than usual.   Spotting of blood in your stool or on the toilet paper.  IF YOU HAD POLYPS REMOVED DURING THE COLONOSCOPY:  No aspirin products for 7 days or as instructed.   No alcohol for 7 days or as instructed.   Eat a soft diet for the next 24 hours.  FINDING OUT THE RESULTS OF YOUR TEST Not all test results are available during your visit. If your test results are not back during the visit, make an appointment  with your caregiver to find out the results. Do not assume everything is normal if you have not heard from your caregiver or the medical facility. It is important for you to follow up on all of your test results.  SEEK IMMEDIATE MEDICAL ATTENTION IF:  You have more than a spotting of blood in your stool.   Your belly is swollen (abdominal distention).   You are nauseated or vomiting.   You have a temperature over 101.   You have abdominal pain or discomfort that is severe or gets worse throughout the day.    Diverticulosis information provided  Further recommendations to follow pending review of pathology report  Office visit with [place] in 1 month on May 25th at 330  At patient request, I called Otila Kluver Wickstrom at [phone] and reviewed results.    Diverticulosis  Diverticulosis is a condition that develops when small pouches (diverticula) form in the wall of the large intestine (colon). The colon is where water is absorbed and stool (feces) is formed. The pouches form when the inside layer of the colon pushes through weak spots in the outer layers of the colon. You may have a few pouches or many of them. The pouches usually do not cause problems unless they become inflamed or infected. When this happens, the condition is called diverticulitis. What are the causes? The cause of this condition is not known. What increases the risk? The following factors may make you more  likely to develop this condition:  Being older than age 21. Your risk for this condition increases with age. Diverticulosis is rare among people younger than age 77. By age 23, many people have it.  Eating a low-fiber diet.  Having frequent constipation.  Being overweight.  Not getting enough exercise.  Smoking.  Taking over-the-counter pain medicines, like aspirin and ibuprofen.  Having a family history of diverticulosis. What are the signs or symptoms? In most people, there are no symptoms of this  condition. If you do have symptoms, they may include:  Bloating.  Cramps in the abdomen.  Constipation or diarrhea.  Pain in the lower left side of the abdomen. How is this diagnosed? Because diverticulosis usually has no symptoms, it is most often diagnosed during an exam for other colon problems. The condition may be diagnosed by:  Using a flexible scope to examine the colon (colonoscopy).  Taking an X-ray of the colon after dye has been put into the colon (barium enema).  Having a CT scan. How is this treated? You may not need treatment for this condition. Your health care provider may recommend treatment to prevent problems. You may need treatment if you have symptoms or if you previously had diverticulitis. Treatment may include:  Eating a high-fiber diet.  Taking a fiber supplement.  Taking a live bacteria supplement (probiotic).  Taking medicine to relax your colon. Follow these instructions at home: Medicines  Take over-the-counter and prescription medicines only as told by your health care provider.  If told by your health care provider, take a fiber supplement or probiotic. Constipation prevention Your condition may cause constipation. To prevent or treat constipation, you may need to:  Drink enough fluid to keep your urine pale yellow.  Take over-the-counter or prescription medicines.  Eat foods that are high in fiber, such as beans, whole grains, and fresh fruits and vegetables.  Limit foods that are high in fat and processed sugars, such as fried or sweet foods.  General instructions  Try not to strain when you have a bowel movement.  Keep all follow-up visits as told by your health care provider. This is important. Contact a health care provider if you:  Have pain in your abdomen.  Have bloating.  Have cramps.  Have not had a bowel movement in 3 days. Get help right away if:  Your pain gets worse.  Your bloating becomes very bad.  You have  a fever or chills, and your symptoms suddenly get worse.  You vomit.  You have bowel movements that are bloody or black.  You have bleeding from your rectum. Summary  Diverticulosis is a condition that develops when small pouches (diverticula) form in the wall of the large intestine (colon).  You may have a few pouches or many of them.  This condition is most often diagnosed during an exam for other colon problems.  Treatment may include increasing the fiber in your diet, taking supplements, or taking medicines. This information is not intended to replace advice given to you by your health care provider. Make sure you discuss any questions you have with your health care provider. Document Revised: 08/20/2018 Document Reviewed: 08/20/2018 Elsevier Patient Education  [place] After These instructions provide you with information about caring for yourself after your procedure. Your health care provider may also give you more specific instructions. Your treatment has been planned according to current medical practices, but problems sometimes occur. Call your health  care provider if you have any problems or questions after your procedure. What can I expect after the procedure? After your procedure, you may:  Feel sleepy for several hours.  Feel clumsy and have poor balance for several hours.  Feel forgetful about what happened after the procedure.  Have poor judgment for several hours.  Feel nauseous or vomit.  Have a sore throat if you had a breathing tube during the procedure. Follow these instructions at home: For at least 24 hours after the procedure:      Have a responsible adult stay with you. It is important to have someone help care for you until you are awake and alert.  Rest as needed.  Do not: ? Participate in activities in which you could fall or become injured. ? Drive. ? Use heavy machinery. ? Drink  alcohol. ? Take sleeping pills or medicines that cause drowsiness. ? Make important decisions or sign legal documents. ? Take care of children on your own. Eating and drinking  Follow the diet that is recommended by your health care provider.  If you vomit, drink water, juice, or soup when you can drink without vomiting.  Make sure you have little or no nausea before eating solid foods. General instructions  Take over-the-counter and prescription medicines only as told by your health care provider.  If you have sleep apnea, surgery and certain medicines can increase your risk for breathing problems. Follow instructions from your health care provider about wearing your sleep device: ? Anytime you are sleeping, including during daytime naps. ? While taking prescription pain medicines, sleeping medicines, or medicines that make you drowsy.  If you smoke, do not smoke without supervision.  Keep all follow-up visits as told by your health care provider. This is important. Contact a health care provider if:  You keep feeling nauseous or you keep vomiting.  You feel light-headed.  You develop a rash.  You have a fever. Get help right away if:  You have trouble breathing. Summary  For several hours after your procedure, you may feel sleepy and have poor judgment.  Have a responsible adult stay with you for at least 24 hours or until you are awake and alert. This information is not intended to replace advice given to you by your health care provider. Make sure you discuss any questions you have with your health care provider. Document Revised: 04/21/2017 Document Reviewed: 05/14/2015 Elsevier Patient Education  [place].

## 2019-06-03 NOTE — Anesthesia Preprocedure Evaluation (Signed)
Anesthesia Evaluation  Patient identified by MRN, date of birth, ID band Patient awake    Reviewed: Allergy & Precautions, NPO status , Patient's Chart, lab work & pertinent test results, reviewed documented beta blocker date and time   Airway Mallampati: II  TM Distance: >3 FB Neck ROM: Full    Dental  (+) Upper Dentures   Pulmonary former smoker,    Pulmonary exam normal breath sounds clear to auscultation       Cardiovascular Exercise Tolerance: Good hypertension, Pt. on home beta blockers and Pt. on medications + CAD  Normal cardiovascular exam+ dysrhythmias Atrial Fibrillation  Rhythm:Regular Rate:Normal     Neuro/Psych PSYCHIATRIC DISORDERS Anxiety Depression Dementia  Neuromuscular disease CVA, Residual Symptoms    GI/Hepatic Neg liver ROS, PUD, GERD  Medicated and Controlled,  Endo/Other  diabetes, Well Controlled, Type 2Hypothyroidism   Renal/GU negative Renal ROS     Musculoskeletal  (+) Fibromyalgia -, narcotic dependentPolymyalgia     Abdominal   Peds  Hematology   Anesthesia Other Findings On prednisone for polymyalgia  Reproductive/Obstetrics                             Anesthesia Physical  Anesthesia Plan  ASA: III  Anesthesia Plan: General   Post-op Pain Management:    Induction: Intravenous  PONV Risk Score and Plan: 2 and Treatment may vary due to age or medical condition and TIVA  Airway Management Planned: Nasal Cannula, Natural Airway and Simple Face Mask  Additional Equipment:   Intra-op Plan:   Post-operative Plan:   Informed Consent: I have reviewed the patients History and Physical, chart, labs and discussed the procedure including the risks, benefits and alternatives for the proposed anesthesia with the patient or authorized representative who has indicated his/her understanding and acceptance.     Dental advisory given  Plan Discussed with: CRNA  and Surgeon  Anesthesia Plan Comments:         Anesthesia Quick Evaluation

## 2019-06-03 NOTE — Anesthesia Postprocedure Evaluation (Addendum)
Anesthesia Post Note  Patient: Tina Schultz  Procedure(s) Performed: COLONOSCOPY WITH PROPOFOL (N/A ) BIOPSY  Patient location during evaluation: PACU Anesthesia Type: General Level of consciousness: awake and alert and oriented Pain management: satisfactory to patient Vital Signs Assessment: post-procedure vital signs reviewed and stable Respiratory status: spontaneous breathing and respiratory function stable Cardiovascular status: blood pressure returned to baseline Postop Assessment: no apparent nausea or vomiting Anesthetic complications: no     Last Vitals:  Vitals:   06/03/19 1435 06/03/19 1505  BP: 122/64 (!) 144/76  Pulse: 80 76  Resp: 16 16  Temp:  36.5 C  SpO2: 98% 97%    Last Pain:  Vitals:   06/03/19 1505  TempSrc: Oral  PainSc: 4                  [facility] Johnda Billiot

## 2019-06-03 NOTE — Addendum Note (Signed)
Addendum  created 06/03/19 1612 by Denese Killings, MD   Clinical Note Signed

## 2019-06-03 NOTE — H&P (Signed)
done

## 2019-06-03 NOTE — Transfer of Care (Signed)
Immediate Anesthesia Transfer of Care Note  Patient: Tina Schultz  Procedure(s) Performed: COLONOSCOPY WITH PROPOFOL (N/A ) BIOPSY  Patient Location: PACU  Anesthesia Type:General  Level of Consciousness: drowsy and responds to stimulation  Airway & Oxygen Therapy: Patient Spontanous Breathing  Post-op Assessment: Report given to RN and Post -op Vital signs reviewed and stable  Post vital signs: Reviewed and stable  Last Vitals:  Vitals Value Taken Time  BP 110/47 06/03/19 1432  Temp    Pulse 83 06/03/19 1433  Resp 18 06/03/19 1433  SpO2 99 % 06/03/19 1433  Vitals shown include unvalidated device data.  Last Pain:  Vitals:   06/03/19 1401  TempSrc:   PainSc: 0-No pain      Patients Stated Pain Goal: 7 (17/61/60 7371)  Complications: No apparent anesthesia complications

## 2019-06-03 NOTE — Op Note (Signed)
[facility] Patient Name: Tina Schultz Procedure Date: 06/03/2019 1:41 PM MRN: [id number] Date of Birth: 1945/09/28 Attending MD: Norvel Richards , MD CSN: [id number] Age: 74 Admit Type: Outpatient Procedure:                Ileo-colonoscopy with segmental biopsy Indications:              Chronic diarrhea; possible history of IBD Providers:                Norvel Richards, MD, Otis Peak B. Sharon Seller, RN,                            Nelma Rothman, Technician Referring MD:             Mitzie Na. Daniel MD, Medicines:                Propofol per Anesthesia Complications:            No immediate complications. Estimated Blood Loss:     Estimated blood loss was minimal. Procedure:                Pre-Anesthesia Assessment:                           - Prior to the procedure, a History and Physical                            was performed, and patient medications and                            allergies were reviewed. The patient's tolerance of                            previous anesthesia was also reviewed. The risks                            and benefits of the procedure and the sedation                            options and risks were discussed with the patient.                            All questions were answered, and informed consent                            was obtained. Prior Anticoagulants: The patient has                            taken no previous anticoagulant or antiplatelet                            agents. ASA Grade Assessment: II - A patient with                            mild systemic disease. After reviewing the risks  and benefits, the patient was deemed in                            satisfactory condition to undergo the procedure.                           After obtaining informed consent, the colonoscope                            was passed under direct vision. Throughout the                            procedure, the patient's blood  pressure, pulse, and                            oxygen saturations were monitored continuously. The                            CF-[id number] ([id number]) scope was introduced through                            the anus and advanced to the the cecum, identified                            by appendiceal orifice and ileocecal valve. The                            colonoscopy was performed without difficulty. The                            patient tolerated the procedure well. The quality                            of the bowel preparation was adequate. Scope In: 2:05:09 PM Scope Out: 2:27:05 PM Scope Withdrawal Time: 0 hours 8 minutes 15 seconds  Total Procedure Duration: 0 hours 21 minutes 56 seconds  Findings:      The perianal and digital rectal examinations were normal.      Scattered medium-mouthed diverticula were found in the entire colon.      The exam was otherwise without abnormality on direct and retroflexion       views. The distal 10 cm of terminal ileum appeared normal. The colon was       redundant. Changing of the patient's position and external abdominal       pressure required to reach the cecum. Segmental biopsies of the right       and left colon taken. Impression:               - Diverticulosis in the entire examined colon.                           - The examination was otherwise normal on direct                            and retroflexion views. Redundant colon. Normal  terminal ileum.                           -Status post segmental biopsy. I doubt a history of                            inflammatory bowel disease. Segmental biopsies                            taken to rule out microscopic colitis although I                            think there is an element of IBS in the picture. Moderate Sedation:      Moderate (conscious) sedation was personally administered by an       anesthesia professional. The following parameters were monitored: oxygen        saturation, heart rate, blood pressure, respiratory rate, EKG, adequacy       of pulmonary ventilation, and response to care. Recommendation:           - Patient has a contact number available for                            emergencies. The signs and symptoms of potential                            delayed complications were discussed with the                            patient. Return to normal activities tomorrow.                            Written discharge instructions were provided to the                            patient.                           - Advance diet as tolerated.                           - Continue present medications.                           - No repeat colonoscopy due to age. Follow-up on                            pathology.                           - Return to GI clinic in 4 weeks. Procedure Code(s):        --- Professional ---                           [phone], Colonoscopy, flexible; diagnostic, including  collection of specimen(s) by brushing or washing,                            when performed (separate procedure) Diagnosis Code(s):        --- Professional ---                           K52.9, Noninfective gastroenteritis and colitis,                            unspecified                           K57.30, Diverticulosis of large intestine without                            perforation or abscess without bleeding CPT copyright 2019 American Medical Association. All rights reserved. The codes documented in this report are preliminary and upon coder review may  be revised to meet current compliance requirements. Cristopher Estimable. Ieasha Boerema, MD Norvel Richards, MD 06/03/2019 2:44:00 PM This report has been signed electronically. Number of Addenda: 0

## 2019-06-04 DIAGNOSIS — F331 Major depressive disorder, recurrent, moderate: Secondary | ICD-10-CM | POA: Diagnosis not present

## 2019-06-04 DIAGNOSIS — E039 Hypothyroidism, unspecified: Secondary | ICD-10-CM | POA: Diagnosis not present

## 2019-06-04 DIAGNOSIS — I10 Essential (primary) hypertension: Secondary | ICD-10-CM | POA: Diagnosis not present

## 2019-06-07 ENCOUNTER — Encounter: Payer: Self-pay | Admitting: Internal Medicine

## 2019-06-07 LAB — SURGICAL PATHOLOGY

## 2019-06-21 ENCOUNTER — Other Ambulatory Visit: Payer: Self-pay | Admitting: Physician Assistant

## 2019-06-21 DIAGNOSIS — Z1231 Encounter for screening mammogram for malignant neoplasm of breast: Secondary | ICD-10-CM

## 2019-06-29 ENCOUNTER — Ambulatory Visit (INDEPENDENT_AMBULATORY_CARE_PROVIDER_SITE_OTHER): Payer: PPO | Admitting: Gastroenterology

## 2019-06-29 ENCOUNTER — Encounter: Payer: Self-pay | Admitting: Gastroenterology

## 2019-06-29 ENCOUNTER — Other Ambulatory Visit: Payer: Self-pay

## 2019-06-29 DIAGNOSIS — R198 Other specified symptoms and signs involving the digestive system and abdomen: Secondary | ICD-10-CM

## 2019-06-29 NOTE — Patient Instructions (Addendum)
Increase Benefiber to 2 teaspoons a day.   Please call if this is not helpful.  We will see yoiu in 6 months!  Annitta Needs, PhD, ANP-BC [facility]

## 2019-06-29 NOTE — Progress Notes (Signed)
Primary Care Physician:  Caryl Bis, MD  Primary GI: Dr. Gala Romney   Patient Location: Home   Provider Location: [facility] office   Reason for Visit: Follow-up    Persons present on the virtual encounter, with roles: Patient and NP  Due to COVID-19, visit was conducted using virtual method.  Visit was requested by patient.  Virtual Visit via Telephone Note Due to COVID-19, visit is conducted virtually and was requested by patient.   I connected with Tina Schultz on 06/29/19 at  3:30 PM EDT by telephone and verified that I am speaking with the correct person using two identifiers.   I discussed the limitations, risks, security and privacy concerns of performing an evaluation and management service by telephone and the availability of in person appointments. I also discussed with the patient that there may be a patient responsible charge related to this service. The patient expressed understanding and agreed to proceed.  Chief Complaint  Patient presents with  . Follow-up     History of Present Illness: 74 y.o. female presenting today with a reported history of numerous polyps in remote past (patient reporting 20+ but no reports available), possible history of UC but unable to verify this, and last colonoscopy in 2013 by Dr. Britta Mccreedy with diverticulosis and unremarkable colonic biopsies. Colonoscopy in interim from last visit with pancolonic diverticulosis, normal TI. Benign colonic biopsies. Doubt dealing with history of IBD.    Having more constipation than diarrhea. Taking Benefiber, 1 teaspoon a day. No GERD. No N/V. No dysphagia. No rectal bleeding. No abdominal pain. No other concerns.   Past Medical History:  Diagnosis Date  . Anxiety   . Asthma    asthmatic bronchitis  . Coronary artery disease   . 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 Depression   . Diabetes mellitus without complication ([place])   . Dysrhythmia   . Fibromyalgia   . GERD (gastroesophageal  reflux disease)   . Glaucoma   . Hypertension   . Hypothyroidism   . IBS (irritable bowel syndrome)   . Polymyalgia rheumatica ([place])   . Rheumatic fever   . Stroke [facility]) [place]   left sided weakness; memorey loss  . Thyroid disease   . Ulcerative colitis ([place])    per patient diagnosed at age 57     Past Surgical History:  Procedure Laterality Date  . ABDOMINAL HYSTERECTOMY    . BIOPSY  06/03/2019   Procedure: BIOPSY;  Surgeon: Daneil Dolin, MD;  Location: [facility] ENDO SUITE;  Service: Endoscopy;;  . CATARACT EXTRACTION W/PHACO Right 10/15/2016   Procedure: CATARACT EXTRACTION PHACO AND INTRAOCULAR LENS PLACEMENT ([place]);  Surgeon: Rutherford Guys, MD;  Location: [facility] ORS;  Service: Ophthalmology;  Laterality: Right;  CDE: 7.56  . CATARACT EXTRACTION W/PHACO Left 01/14/2017   Procedure: CATARACT EXTRACTION PHACO AND INTRAOCULAR LENS PLACEMENT (IOC);  Surgeon: Rutherford Guys, MD;  Location: [facility] ORS;  Service: Ophthalmology;  Laterality: Left;  CDE: 6.28  . CHOLECYSTECTOMY    . COLONOSCOPY  2013   Dr. Britta Mccreedy: mild left-sided diverticulosis, multiple biopsies with path unremarkable.   . COLONOSCOPY WITH PROPOFOL N/A 06/03/2019   Procedure: COLONOSCOPY WITH PROPOFOL;  Surgeon: Daneil Dolin, MD;  Location: [facility] END[facility] SUITE;  Service: Endoscopy;  Laterality: N/A;  . ELBOW DEBRIDEMENT Right   . THYROID SURGERY     partial thyroidectomy age 80.     Current Meds  Medication Sig  . alendronate (  FOSAMAX) 70 MG tablet Take 70 mg by mouth every Wednesday. Take with a full glass of water on an empty stomach.  Marland Kitchen atenolol (TENORMIN) 25 MG tablet Take 25 mg by mouth daily.  . Camphor-Menthol-Methyl Sal (SALONPAS) 3.02-10-08 % PTCH Apply 1 packet topically daily as needed (Back pain).  . cyclobenzaprine (FLEXERIL) 10 MG tablet Take 10 mg by mouth 3 (three) times daily as needed for muscle spasms.  . cycloSPORINE (RESTASIS) 0.05 % ophthalmic emulsion Place 1 drop into both eyes daily.  .  diphenhydramine-acetaminophen (TYLENOL PM) 25-500 MG TABS tablet Take 1 tablet by mouth at bedtime.   Marland Kitchen estradiol (ESTRACE) 0.5 MG tablet Take 0.5 mg by mouth at bedtime.   . gabapentin (NEURONTIN) 600 MG tablet Take 600 mg by mouth 2 (two) times daily.   Marland Kitchen HYDROcodone-acetaminophen (NORCO/VICODIN) 5-325 MG tablet Take 1 tablet by mouth every 6 (six) hours as needed for moderate pain.  Marland Kitchen latanoprost (XALATAN) 0.005 % ophthalmic solution Place 1 drop into both eyes at bedtime.  Marland Kitchen levothyroxine (SYNTHROID) 88 MCG tablet Take 88 mcg by mouth daily before breakfast.  . midodrine (PROAMATINE) 2.5 MG tablet TAKE 1 TABLET (2.5 MG TOTAL) BY MOUTH 2 (TWO) TIMES DAILY WITH A MEAL. (Patient taking differently: Take 2.5 mg by mouth daily. )  . omeprazole (PRILOSEC) 20 MG capsule Take 20 mg by mouth daily.  . predniSONE (DELTASONE) 5 MG tablet Take 5 mg by mouth daily.   Marland Kitchen venlafaxine (EFFEXOR) 75 MG tablet Take 75 mg by mouth daily.     Family History  Problem Relation Age of Onset  . Cancer Mother   . Stroke Father   . Addison's disease Sister   . Alzheimer's disease Brother   . Colon cancer Niece        deceased at 38     Social History   Socioeconomic History  . Marital status: Married    Spouse name: Not on file  . Number of children: Not on file  . Years of education: Not on file  . Highest education level: Not on file  Occupational History  . Occupation: retired    Comment: [employer], cook  Tobacco Use  . Smoking status: Former Smoker    Types: Cigarettes    Quit date: 12/08/1989    Years since quitting: 29.5  . Smokeless tobacco: Never Used  Substance and Sexual Activity  . Alcohol use: No  . Drug use: No  . Sexual activity: Never  Other Topics Concern  . Not on file  Social History Narrative  . Not on file   Social Determinants of Health   Financial Resource Strain:   . Difficulty of Paying Living Expenses:   Food Insecurity:   . Worried About Sales executive in the Last Year:   . Arboriculturist in the Last Year:   Transportation Needs:   . Film/video editor (Medical):   Marland Kitchen Lack of Transportation (Non-Medical):   Physical Activity:   . Days of Exercise per Week:   . Minutes of Exercise per Session:   Stress:   . Feeling of Stress :   Social Connections:   . Frequency of Communication with Friends and Family:   . Frequency of Social Gatherings with Friends and Family:   . Attends Religious Services:   . Active Member of Clubs or Organizations:   . Attends Archivist Meetings:   Marland Kitchen Marital Status:  Review of Systems: Gen: Denies fever, chills, anorexia. Denies fatigue, weakness, weight loss.  CV: Denies chest pain, palpitations, syncope, peripheral edema, and claudication. Resp: Denies dyspnea at rest, cough, wheezing, coughing up blood, and pleurisy. GI: see HPI Derm: Denies rash, itching, dry skin Psych: Denies depression, anxiety, memory loss, confusion. No homicidal or suicidal ideation.  Heme: Denies bruising, bleeding, and enlarged lymph nodes.  Observations/Objective: No distress. Unable to perform physical exam due to telephone encounter. No video available.   Assessment and Plan: Pleasant 74 year old female presenting via telephone for routine follow-up, reporting history of UC but doubt this was the case. Past colonoscopy from outside facility without evidence for IBD, and most recent with diverticulosis and unremarkable colonic biopsies.   Currently dealing moreso with mild constipation. We will increase Benefiber, as she is only taking 1 teaspoon a day. She is to call if no improvement. Return in 6 months or sooner if needed.   Follow Up Instructions: See AVS   I discussed the assessment and treatment plan with the patient. The patient was provided an opportunity to ask questions and all were answered. The patient agreed with the plan and demonstrated an understanding of the instructions.   The  patient was advised to call back or seek an in-person evaluation if the symptoms worsen or if the condition fails to improve as anticipated.  I provided 10 minutes of non-face-to-face time during this encounter.  Annitta Needs, PhD, ANP-BC [facility] Gastroenterology

## 2019-06-30 ENCOUNTER — Telehealth: Payer: Self-pay | Admitting: *Deleted

## 2019-06-30 ENCOUNTER — Encounter: Payer: Self-pay | Admitting: Gastroenterology

## 2019-06-30 NOTE — Telephone Encounter (Signed)
Tina Schultz, you are scheduled for a virtual visit with your provider today.  Just as we do with appointments in the office, we must obtain your consent to participate.  Your consent will be active for this visit and any virtual visit you may have with one of our providers in the next 365 days.  If you have a MyChart account, I can also send a copy of this consent to you electronically.  All virtual visits are billed to your insurance company just like a traditional visit in the office.  As this is a virtual visit, video technology does not allow for your provider to perform a traditional examination.  This may limit your provider's ability to fully assess your condition.  If your provider identifies any concerns that need to be evaluated in person or the need to arrange testing such as labs, EKG, etc, we will make arrangements to do so.  Although advances in technology are sophisticated, we cannot ensure that it will always work on either your end or our end.  If the connection with a video visit is poor, we may have to switch to a telephone visit.  With either a video or telephone visit, we are not always able to ensure that we have a secure connection.   I need to obtain your verbal consent now.   Are you willing to proceed with your visit today?

## 2019-06-30 NOTE — Telephone Encounter (Signed)
Pt consented to telephone visit on 06/29/19.

## 2019-07-01 ENCOUNTER — Ambulatory Visit
Admission: RE | Admit: 2019-07-01 | Discharge: 2019-07-01 | Disposition: A | Discharge status: Home / Self Care | Payer: PPO | Source: Ambulatory Visit | Attending: Physician Assistant | Admitting: Physician Assistant

## 2019-07-01 ENCOUNTER — Other Ambulatory Visit: Payer: Self-pay

## 2019-07-01 DIAGNOSIS — M85852 Other specified disorders of bone density and structure, left thigh: Secondary | ICD-10-CM | POA: Diagnosis not present

## 2019-07-01 DIAGNOSIS — Z1231 Encounter for screening mammogram for malignant neoplasm of breast: Secondary | ICD-10-CM

## 2019-07-01 DIAGNOSIS — Z7952 Long term (current) use of systemic steroids: Secondary | ICD-10-CM

## 2019-07-01 DIAGNOSIS — Z780 Asymptomatic menopausal state: Secondary | ICD-10-CM | POA: Diagnosis not present

## 2019-07-02 ENCOUNTER — Encounter: Payer: Self-pay | Admitting: Gastroenterology

## 2019-07-13 DIAGNOSIS — H04123 Dry eye syndrome of bilateral lacrimal glands: Secondary | ICD-10-CM | POA: Diagnosis not present

## 2019-07-13 DIAGNOSIS — H524 Presbyopia: Secondary | ICD-10-CM | POA: Diagnosis not present

## 2019-07-13 DIAGNOSIS — E119 Type 2 diabetes mellitus without complications: Secondary | ICD-10-CM | POA: Diagnosis not present

## 2019-07-13 DIAGNOSIS — Z961 Presence of intraocular lens: Secondary | ICD-10-CM | POA: Diagnosis not present

## 2019-07-13 DIAGNOSIS — H5203 Hypermetropia, bilateral: Secondary | ICD-10-CM | POA: Diagnosis not present

## 2019-07-13 DIAGNOSIS — H401131 Primary open-angle glaucoma, bilateral, mild stage: Secondary | ICD-10-CM | POA: Diagnosis not present

## 2019-07-22 DIAGNOSIS — M353 Polymyalgia rheumatica: Secondary | ICD-10-CM | POA: Diagnosis not present

## 2019-07-22 DIAGNOSIS — M2550 Pain in unspecified joint: Secondary | ICD-10-CM | POA: Diagnosis not present

## 2019-07-22 DIAGNOSIS — Z7952 Long term (current) use of systemic steroids: Secondary | ICD-10-CM | POA: Diagnosis not present

## 2019-07-22 DIAGNOSIS — M797 Fibromyalgia: Secondary | ICD-10-CM | POA: Diagnosis not present

## 2019-07-22 DIAGNOSIS — Z6823 Body mass index (BMI) 23.0-23.9, adult: Secondary | ICD-10-CM | POA: Diagnosis not present

## 2019-07-22 DIAGNOSIS — R634 Abnormal weight loss: Secondary | ICD-10-CM | POA: Diagnosis not present

## 2019-07-22 DIAGNOSIS — R5383 Other fatigue: Secondary | ICD-10-CM | POA: Diagnosis not present

## 2019-07-22 DIAGNOSIS — M8589 Other specified disorders of bone density and structure, multiple sites: Secondary | ICD-10-CM | POA: Diagnosis not present

## 2019-08-04 DIAGNOSIS — M4807 Spinal stenosis, lumbosacral region: Secondary | ICD-10-CM | POA: Diagnosis not present

## 2019-08-04 DIAGNOSIS — E039 Hypothyroidism, unspecified: Secondary | ICD-10-CM | POA: Diagnosis not present

## 2019-08-04 DIAGNOSIS — E1165 Type 2 diabetes mellitus with hyperglycemia: Secondary | ICD-10-CM | POA: Diagnosis not present

## 2019-08-04 DIAGNOSIS — I10 Essential (primary) hypertension: Secondary | ICD-10-CM | POA: Diagnosis not present

## 2019-09-03 DIAGNOSIS — E039 Hypothyroidism, unspecified: Secondary | ICD-10-CM | POA: Diagnosis not present

## 2019-09-03 DIAGNOSIS — M4807 Spinal stenosis, lumbosacral region: Secondary | ICD-10-CM | POA: Diagnosis not present

## 2019-09-03 DIAGNOSIS — E1165 Type 2 diabetes mellitus with hyperglycemia: Secondary | ICD-10-CM | POA: Diagnosis not present

## 2019-09-03 DIAGNOSIS — I10 Essential (primary) hypertension: Secondary | ICD-10-CM | POA: Diagnosis not present

## 2019-09-30 DIAGNOSIS — E038 Other specified hypothyroidism: Secondary | ICD-10-CM | POA: Diagnosis not present

## 2019-10-08 DIAGNOSIS — Z20828 Contact with and (suspected) exposure to other viral communicable diseases: Secondary | ICD-10-CM | POA: Diagnosis not present

## 2019-11-04 DIAGNOSIS — E1165 Type 2 diabetes mellitus with hyperglycemia: Secondary | ICD-10-CM | POA: Diagnosis not present

## 2019-11-04 DIAGNOSIS — I10 Essential (primary) hypertension: Secondary | ICD-10-CM | POA: Diagnosis not present

## 2019-11-04 DIAGNOSIS — E039 Hypothyroidism, unspecified: Secondary | ICD-10-CM | POA: Diagnosis not present

## 2019-11-22 DIAGNOSIS — H401131 Primary open-angle glaucoma, bilateral, mild stage: Secondary | ICD-10-CM | POA: Diagnosis not present

## 2019-11-29 DIAGNOSIS — Z9189 Other specified personal risk factors, not elsewhere classified: Secondary | ICD-10-CM | POA: Diagnosis not present

## 2019-11-29 DIAGNOSIS — E039 Hypothyroidism, unspecified: Secondary | ICD-10-CM | POA: Diagnosis not present

## 2019-11-29 DIAGNOSIS — Z6823 Body mass index (BMI) 23.0-23.9, adult: Secondary | ICD-10-CM | POA: Diagnosis not present

## 2019-11-29 DIAGNOSIS — E1169 Type 2 diabetes mellitus with other specified complication: Secondary | ICD-10-CM | POA: Diagnosis not present

## 2019-11-29 DIAGNOSIS — Z0001 Encounter for general adult medical examination with abnormal findings: Secondary | ICD-10-CM | POA: Diagnosis not present

## 2019-11-29 DIAGNOSIS — Z23 Encounter for immunization: Secondary | ICD-10-CM | POA: Diagnosis not present

## 2019-11-29 DIAGNOSIS — F331 Major depressive disorder, recurrent, moderate: Secondary | ICD-10-CM | POA: Diagnosis not present

## 2019-11-29 DIAGNOSIS — K5190 Ulcerative colitis, unspecified, without complications: Secondary | ICD-10-CM | POA: Diagnosis not present

## 2019-11-29 DIAGNOSIS — E1165 Type 2 diabetes mellitus with hyperglycemia: Secondary | ICD-10-CM | POA: Diagnosis not present

## 2019-11-29 DIAGNOSIS — K219 Gastro-esophageal reflux disease without esophagitis: Secondary | ICD-10-CM | POA: Diagnosis not present

## 2019-11-29 DIAGNOSIS — I10 Essential (primary) hypertension: Secondary | ICD-10-CM | POA: Diagnosis not present

## 2019-11-29 DIAGNOSIS — M353 Polymyalgia rheumatica: Secondary | ICD-10-CM | POA: Diagnosis not present

## 2019-11-29 DIAGNOSIS — Z1212 Encounter for screening for malignant neoplasm of rectum: Secondary | ICD-10-CM | POA: Diagnosis not present

## 2019-11-29 DIAGNOSIS — E8881 Metabolic syndrome: Secondary | ICD-10-CM | POA: Diagnosis not present

## 2019-11-29 DIAGNOSIS — M797 Fibromyalgia: Secondary | ICD-10-CM | POA: Diagnosis not present

## 2019-12-04 DIAGNOSIS — E039 Hypothyroidism, unspecified: Secondary | ICD-10-CM | POA: Diagnosis not present

## 2019-12-04 DIAGNOSIS — I10 Essential (primary) hypertension: Secondary | ICD-10-CM | POA: Diagnosis not present

## 2019-12-04 DIAGNOSIS — M4807 Spinal stenosis, lumbosacral region: Secondary | ICD-10-CM | POA: Diagnosis not present

## 2019-12-20 DIAGNOSIS — Z6823 Body mass index (BMI) 23.0-23.9, adult: Secondary | ICD-10-CM | POA: Diagnosis not present

## 2019-12-20 DIAGNOSIS — G43909 Migraine, unspecified, not intractable, without status migrainosus: Secondary | ICD-10-CM | POA: Diagnosis not present

## 2019-12-20 DIAGNOSIS — R110 Nausea: Secondary | ICD-10-CM | POA: Diagnosis not present

## 2019-12-27 ENCOUNTER — Encounter: Payer: Self-pay | Admitting: Family Medicine

## 2019-12-27 DIAGNOSIS — E8881 Metabolic syndrome: Secondary | ICD-10-CM | POA: Diagnosis not present

## 2019-12-27 DIAGNOSIS — K219 Gastro-esophageal reflux disease without esophagitis: Secondary | ICD-10-CM | POA: Diagnosis not present

## 2019-12-27 DIAGNOSIS — E1165 Type 2 diabetes mellitus with hyperglycemia: Secondary | ICD-10-CM | POA: Diagnosis not present

## 2020-01-04 ENCOUNTER — Ambulatory Visit: Payer: PPO | Admitting: Gastroenterology

## 2020-01-04 ENCOUNTER — Encounter: Payer: Self-pay | Admitting: Gastroenterology

## 2020-01-04 ENCOUNTER — Other Ambulatory Visit: Payer: Self-pay

## 2020-01-04 VITALS — BP 110/63 | HR 56 | Temp 96.6°F | Ht 64.0 in | Wt 136.8 lb

## 2020-01-04 DIAGNOSIS — E039 Hypothyroidism, unspecified: Secondary | ICD-10-CM | POA: Diagnosis not present

## 2020-01-04 DIAGNOSIS — I10 Essential (primary) hypertension: Secondary | ICD-10-CM | POA: Diagnosis not present

## 2020-01-04 DIAGNOSIS — R198 Other specified symptoms and signs involving the digestive system and abdomen: Secondary | ICD-10-CM

## 2020-01-04 DIAGNOSIS — E1165 Type 2 diabetes mellitus with hyperglycemia: Secondary | ICD-10-CM | POA: Diagnosis not present

## 2020-01-04 NOTE — Patient Instructions (Signed)
I am glad you are doing well!  We will see you in 1 year or sooner if needed!   I enjoyed seeing you again today! As you know, I value our relationship and want to provide genuine, compassionate, and quality care. I welcome your feedback. If you receive a survey regarding your visit,  I greatly appreciate you taking time to fill this out. See you next time!  Annitta Needs, PhD, ANP-BC [facility] Gastroenterology

## 2020-01-04 NOTE — Progress Notes (Signed)
Referring Provider: Caryl Bis, MD Primary Care Physician:  Caryl Bis, MD Primary GI: Dr. Gala Romney   Chief Complaint  Patient presents with  . Constipation  . Diarrhea    occ but better    HPI:   Tina Schultz is a 74 y.o. female presenting today with a history of reported history of numerous polyps in remote past (patient reporting 20+ but no reports available), possible history of UC but unable to verify this, and last colonoscopy in 2013 by Dr. Britta Mccreedy with diverticulosis and unremarkable colonic biopsies. Colonoscopy on file this year with pancolonic diverticulosis, normal TI. Benign colonic biopsies   Diarrhea once a week instead of several times a week. No rectal bleeding. Taking Probiotic daily. Not taking fiber routinely and only as needed. No abdominal pain. Appetite comes and goes, which is chronic. Feels well overall.   Past Medical History:  Diagnosis Date  . Anxiety   . Asthma    asthmatic bronchitis  . Coronary artery disease   . Dementia ([place])    3rd stage dementia  . Depression   . Diabetes mellitus without complication ([place])   . Dysrhythmia   . Fibromyalgia   . GERD (gastroesophageal reflux disease)   . Glaucoma   . Hypertension   . Hypothyroidism   . IBS (irritable bowel syndrome)   . Polymyalgia rheumatica ([place])   . Rheumatic fever   . Stroke [facility]) [place]   left sided weakness; memorey loss  . Thyroid disease   . Ulcerative colitis ([place])    per patient diagnosed at age 6    Past Surgical History:  Procedure Laterality Date  . ABDOMINAL HYSTERECTOMY    . BIOPSY  06/03/2019   Procedure: BIOPSY;  Surgeon: Daneil Dolin, MD;  Location[facility];  Service: Endoscopy;;  . CATARACT EXTRACTION W/PHACO Right 10/15/2016   Procedure: CATARACT EXTRACTION PHACO AND INTRAOCULAR LENS PLACEMENT ([place]);  Surgeon: Rutherford Guys, MD;  Location: [facility] ORS;  Service: Ophthalmology;  Laterality: Right;  CDE: 7.56  . CATARACT EXTRACTION  W/PHACO Left 01/14/2017   Procedure: CATARACT EXTRACTION PHACO AND INTRAOCULAR LENS PLACEMENT (I[facility]);  Surgeon: Rutherford Guys, MD;  Location: [facility] ORS;  Service: Ophthalmology;  Laterality: Left;  CDE: 6.28  . CHOLECYSTECTOMY    . COLONOSCOPY  2013   Dr. Britta Mccreedy: mild left-sided diverticulosis, multiple biopsies with path unremarkable.   . COLONOSCOPY WITH PROPOFOL N/A 06/03/2019   pancolonic diverticulosis, normal TI. S/p biopsies. Benign.   . ELBOW DEBRIDEMENT Right   . THYROID SURGERY     partial thyroidectomy age 59.    Current Outpatient Medications  Medication Sig Dispense Refill  . alendronate (FOSAMAX) 70 MG tablet Take 70 mg by mouth every Wednesday. Take with a full glass of water on an empty stomach.    Marland Kitchen atenolol (TENORMIN) 25 MG tablet Take 25 mg by mouth daily.    . Camphor-Menthol-Methyl Sal (SALONPAS) 3.02-10-08 % PTCH Apply 1 packet topically daily as needed (Back pain).    . cyclobenzaprine (FLEXERIL) 10 MG tablet Take 10 mg by mouth 3 (three) times daily as needed for muscle spasms.    . cycloSPORINE (RESTASIS) 0.05 % ophthalmic emulsion Place 1 drop into both eyes daily.    . diphenhydramine-acetaminophen (TYLENOL PM) 25-500 MG TABS tablet Take 1 tablet by mouth at bedtime.     Marland Kitchen estradiol (ESTRACE) 0.5 MG tablet Take 0.5 mg by mouth at bedtime.     . gabapentin (  NEURONTIN) 600 MG tablet Take 600 mg by mouth 2 (two) times daily.     Marland Kitchen HYDROcodone-acetaminophen (NORCO/VICODIN) 5-325 MG tablet Take 1 tablet by mouth every 6 (six) hours as needed for moderate pain.    Marland Kitchen latanoprost (XALATAN) 0.005 % ophthalmic solution Place 1 drop into both eyes at bedtime.    Marland Kitchen levothyroxine (SYNTHROID) 88 MCG tablet Take 88 mcg by mouth daily before breakfast.    . midodrine (PROAMATINE) 2.5 MG tablet TAKE 1 TABLET (2.5 MG TOTAL) BY MOUTH 2 (TWO) TIMES DAILY WITH A MEAL. (Patient taking differently: Take 2.5 mg by mouth daily. ) 180 tablet 1  . omeprazole (PRILOSEC) 20 MG capsule Take 20 mg by  mouth daily.    . predniSONE (DELTASONE) 5 MG tablet Take 5 mg by mouth daily.     . Probiotic Product (PROBIOTIC DAILY PO) Take by mouth daily.    Marland Kitchen venlafaxine (EFFEXOR) 75 MG tablet Take 75 mg by mouth daily.    . Multiple Vitamins-Minerals (MULTIVITAMIN WITH MINERALS) tablet Take 1 tablet by mouth daily. (Patient not taking: Reported on 01/04/2020)    . polyethylene glycol-electrolytes (TRILYTE) 420 g solution Take 4,000 mLs by mouth as directed. (Patient not taking: Reported on 06/29/2019) 4000 mL 0   No current facility-administered medications for this visit.   Facility-Administered Medications Ordered in Other Visits  Medication Dose Route Frequency Provider Last Rate Last Admin  . ondansetron (ZOFRAN) 4 mg in sodium chloride 0.9 % 50 mL IVPB  4 mg Intravenous Q6H PRN Ashok Pall, MD        Allergies as of 01/04/2020 - Review Complete 01/04/2020  Allergen Reaction Noted  . Contrast media [iodinated diagnostic agents] Nausea And Vomiting and Other (See Comments) 03/15/2013  . Aspirin Other (See Comments) 03/15/2013  . Codeine Nausea And Vomiting 03/15/2013  . Morphine and related Nausea And Vomiting 03/15/2013  . Sulfa antibiotics Nausea And Vomiting 03/15/2013  . Benadryl [diphenhydramine] Other (See Comments) 01/04/2020    Family History  Problem Relation Age of Onset  . Cancer Mother   . Stroke Father   . Addison's disease Sister   . Alzheimer's disease Brother   . Colon cancer Niece        deceased at 41     Social History   Socioeconomic History  . Marital status: Married    Spouse name: Not on file  . Number of children: Not on file  . Years of education: Not on file  . Highest education level: Not on file  Occupational History  . Occupation: retired    Comment: [employer], cook  Tobacco Use  . Smoking status: Former Smoker    Types: Cigarettes    Quit date: 12/08/1989    Years since quitting: 30.0  . Smokeless tobacco: Never Used  Vaping Use  .  Vaping Use: Never used  Substance and Sexual Activity  . Alcohol use: No  . Drug use: No  . Sexual activity: Never  Other Topics Concern  . Not on file  Social History Narrative  . Not on file   Social Determinants of Health   Financial Resource Strain:   . Difficulty of Paying Living Expenses: Not on file  Food Insecurity:   . Worried About Charity fundraiser in the Last Year: Not on file  . Ran Out of Food in the Last Year: Not on file  Transportation Needs:   . Lack of Transportation (Medical): Not on file  . Lack of  Transportation (Non-Medical): Not on file  Physical Activity:   . Days of Exercise per Week: Not on file  . Minutes of Exercise per Session: Not on file  Stress:   . Feeling of Stress : Not on file  Social Connections:   . Frequency of Communication with Friends and Family: Not on file  . Frequency of Social Gatherings with Friends and Family: Not on file  . Attends Religious Services: Not on file  . Active Member of Clubs or Organizations: Not on file  . Attends Archivist Meetings: Not on file  . Marital Status: Not on file    Review of Systems: Gen: Denies fever, chills, anorexia. Denies fatigue, weakness, weight loss.  CV: Denies chest pain, palpitations, syncope, peripheral edema, and claudication. Resp: Denies dyspnea at rest, cough, wheezing, coughing up blood, and pleurisy. GI: see HPI  Derm: Denies rash, itching, dry skin Psych: Denies depression, anxiety, memory loss, confusion. No homicidal or suicidal ideation.  Heme: Denies bruising, bleeding, and enlarged lymph nodes.  Physical Exam: BP 110/63   Pulse (!) 56   Temp (!) 96.6 F (35.9 C) (Temporal)   Ht 5' 4"  (1.626 m)   Wt 136 lb 12.8 oz (62.1 kg)   BMI 23.48 kg/m  General:   Alert and oriented. No distress noted. Pleasant and cooperative.  Head:  Normocephalic and atraumatic. Eyes:  Conjuctiva clear without scleral icterus. Mouth:  Mask in place Abdomen:  +BS, soft,  non-tender and non-distended. No rebound or guarding. No HSM or masses noted. Msk:  Symmetrical without gross deformities. Normal posture. Extremities:  Without edema. Neurologic:  Alert and  oriented x4 Psych:  Alert and cooperative. Normal mood and affect.  ASSESSMENT: Tina Schultz is a 74 y.o. female presenting today with history of alternating constipation and diarrhea, doing well now with probiotic daily and taking fiber only as needed. No concerning signs/symptoms. Colonoscopy on file without polyps. Possible history of UC per patient, but this has not been verified in record or documented on recent colonoscopies and past.    PLAN:  Continue probiotic and fiber Return in 1 year or sooner if needed   Annitta Needs, PhD, ANP-BC [facility] Gastroenterology

## 2020-01-12 NOTE — Progress Notes (Signed)
Cc'ed to pcp °

## 2020-01-21 DIAGNOSIS — M2550 Pain in unspecified joint: Secondary | ICD-10-CM | POA: Diagnosis not present

## 2020-01-21 DIAGNOSIS — M8589 Other specified disorders of bone density and structure, multiple sites: Secondary | ICD-10-CM | POA: Diagnosis not present

## 2020-01-21 DIAGNOSIS — Z6823 Body mass index (BMI) 23.0-23.9, adult: Secondary | ICD-10-CM | POA: Diagnosis not present

## 2020-01-21 DIAGNOSIS — M353 Polymyalgia rheumatica: Secondary | ICD-10-CM | POA: Diagnosis not present

## 2020-01-21 DIAGNOSIS — M797 Fibromyalgia: Secondary | ICD-10-CM | POA: Diagnosis not present

## 2020-01-21 DIAGNOSIS — Z7952 Long term (current) use of systemic steroids: Secondary | ICD-10-CM | POA: Diagnosis not present

## 2020-01-21 DIAGNOSIS — R159 Full incontinence of feces: Secondary | ICD-10-CM | POA: Diagnosis not present

## 2020-01-21 DIAGNOSIS — R634 Abnormal weight loss: Secondary | ICD-10-CM | POA: Diagnosis not present

## 2020-01-21 DIAGNOSIS — R5383 Other fatigue: Secondary | ICD-10-CM | POA: Diagnosis not present

## 2020-02-04 DIAGNOSIS — E039 Hypothyroidism, unspecified: Secondary | ICD-10-CM | POA: Diagnosis not present

## 2020-02-04 DIAGNOSIS — I10 Essential (primary) hypertension: Secondary | ICD-10-CM | POA: Diagnosis not present

## 2020-02-04 DIAGNOSIS — E1165 Type 2 diabetes mellitus with hyperglycemia: Secondary | ICD-10-CM | POA: Diagnosis not present

## 2020-02-04 IMAGING — CT CT L SPINE W/ CM
3 series · 11 of 33 positions shown, 13 images · non-contrast
Comparison: Lumbar MRI 04/16/2017

CLINICAL DATA: Lumbar radiculopathy.  Bilateral leg weakness

EXAM:
LUMBAR MYELOGRAM
FLUOROSCOPY TIME:  54 seconds
PROCEDURE:
Lumbar puncture and intrathecal contrast administration were
performed by Dr. Sadivskaja who will separately report for the portion
of the procedure. I personally supervised acquisition of the
myelogram images.
TECHNIQUE: Contiguous axial images were obtained through the Lumbar spine after
the intrathecal infusion of infusion. Coronal and sagittal
reconstructions were obtained of the axial image sets.

[Series 5: l spine 2.0 st · axial · 0.26mm/px · z∈[+836,+972]mm · 3 of 111 slices shown, 4 images]
[im 26/111  soft-tissue]
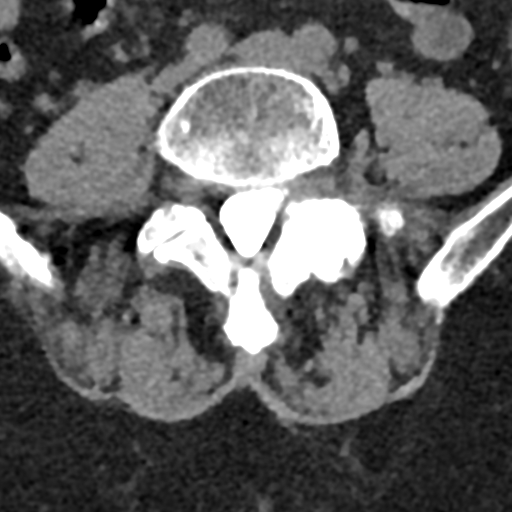
[im 26/111  bone]
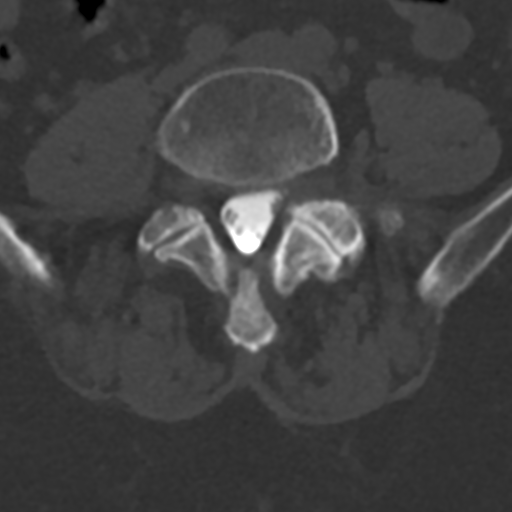
[im 60/111  bone]
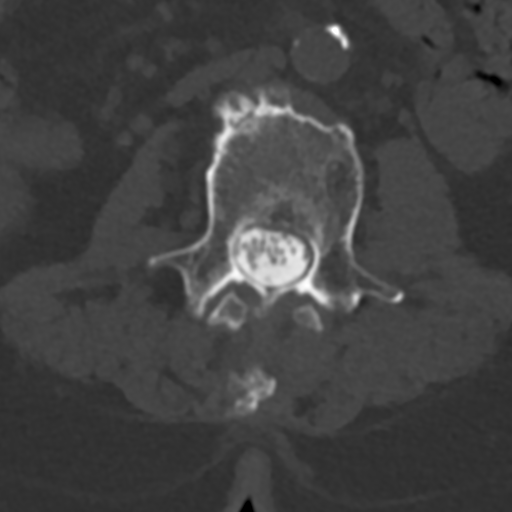
[im 94/111  bone]
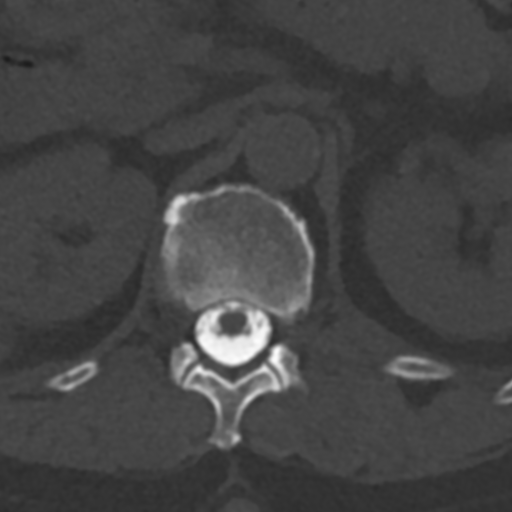

[Series 8: coronal st · coronal · 0.35mm/px · 3 of 71 slices shown]
[im 15/71  bone]
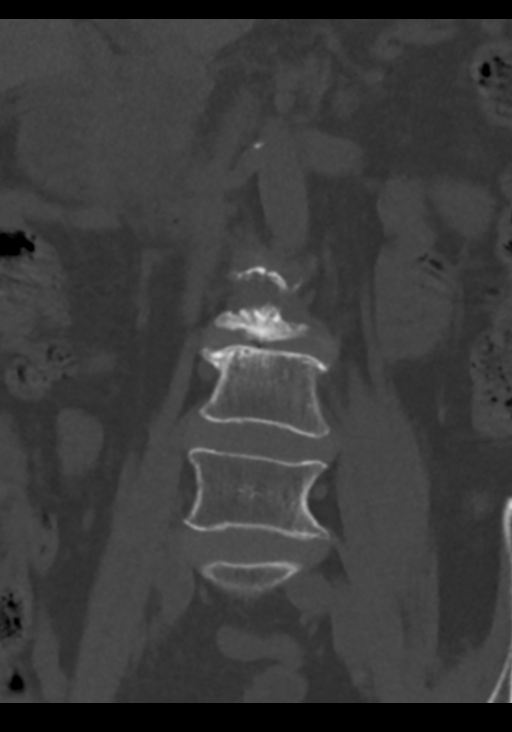
[im 29/71  bone]
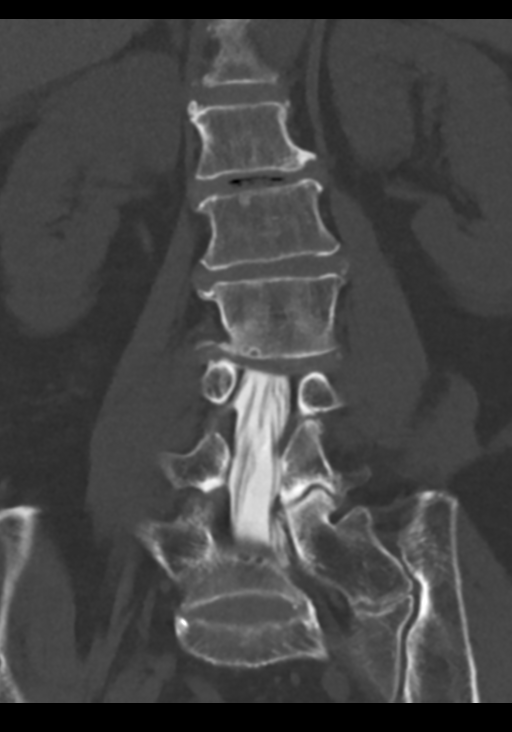
[im 43/71  bone]
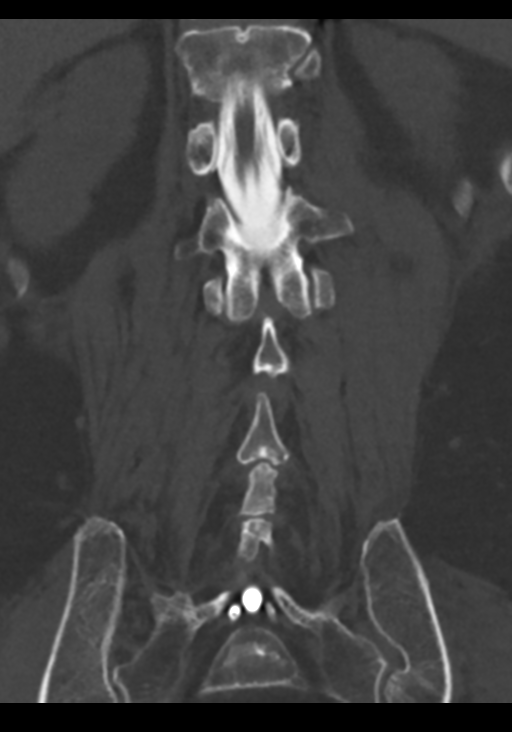

[Series 9: sagittal st · sagittal · 0.28mm/px · 5 of 59 slices shown, 6 images]
[im 20/59  bone]
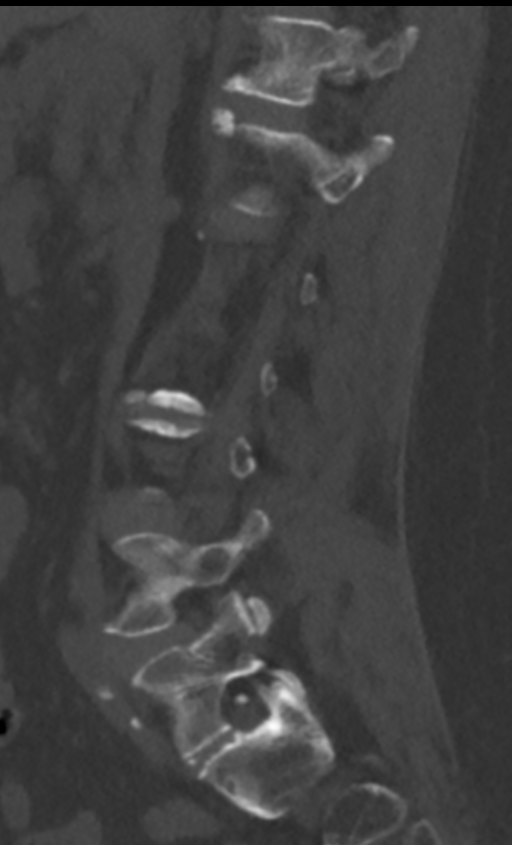
[im 25/59  bone]
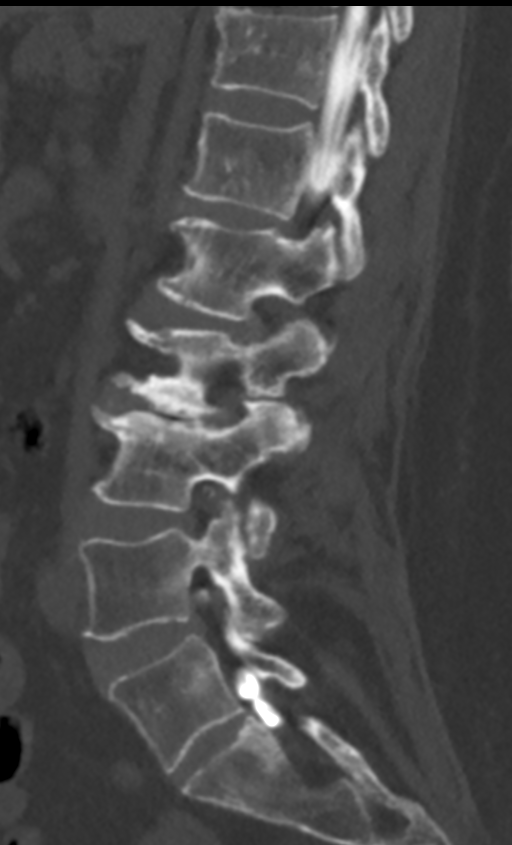
[im 30/59  soft-tissue]
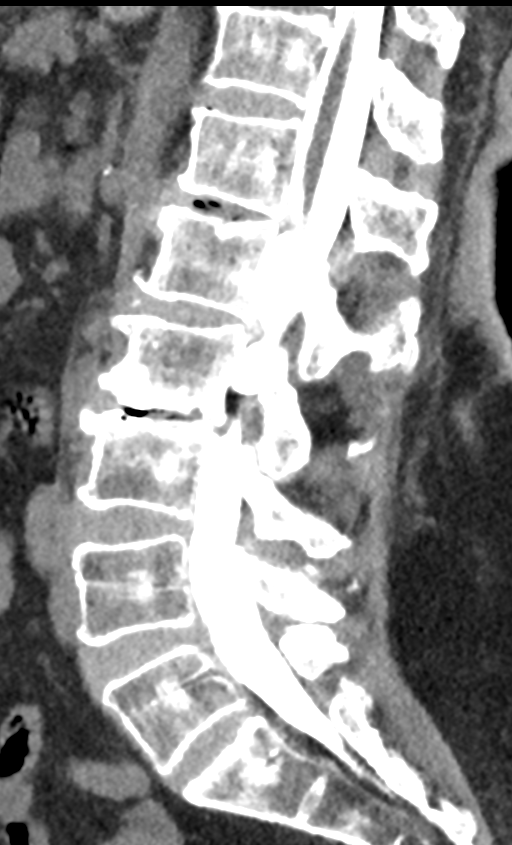
[im 30/59  bone]
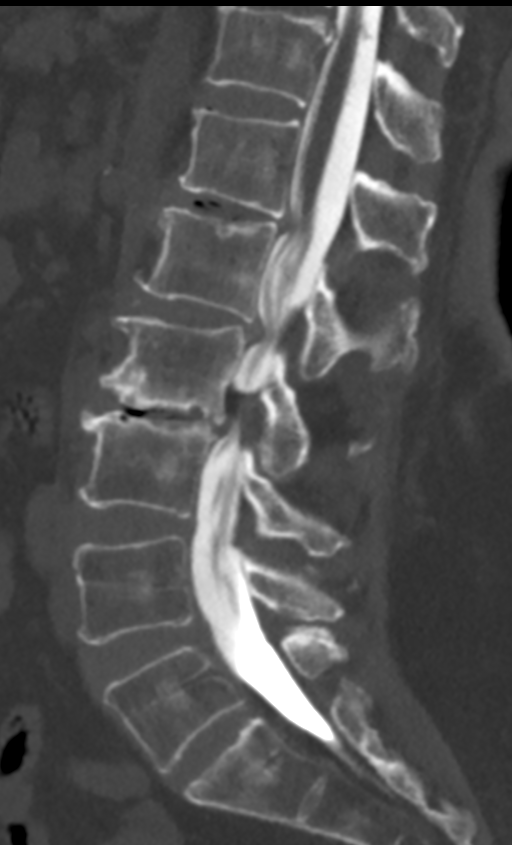
[im 34/59  bone]
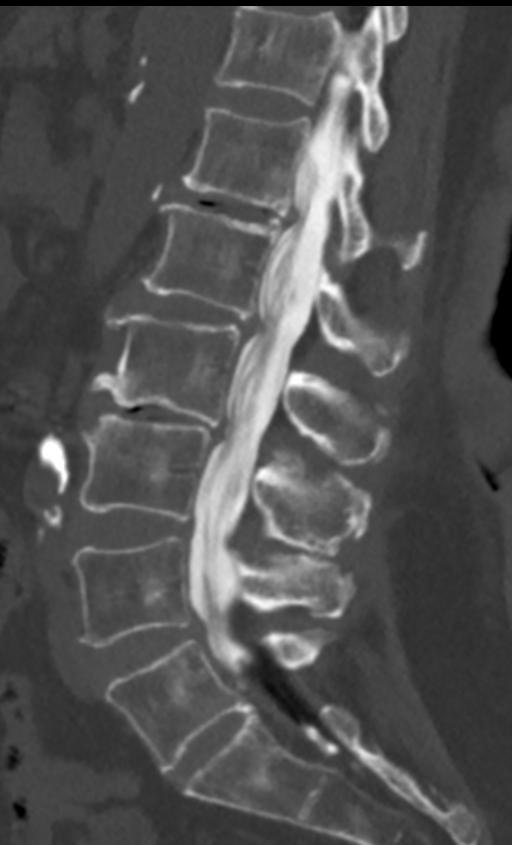
[im 39/59  bone]
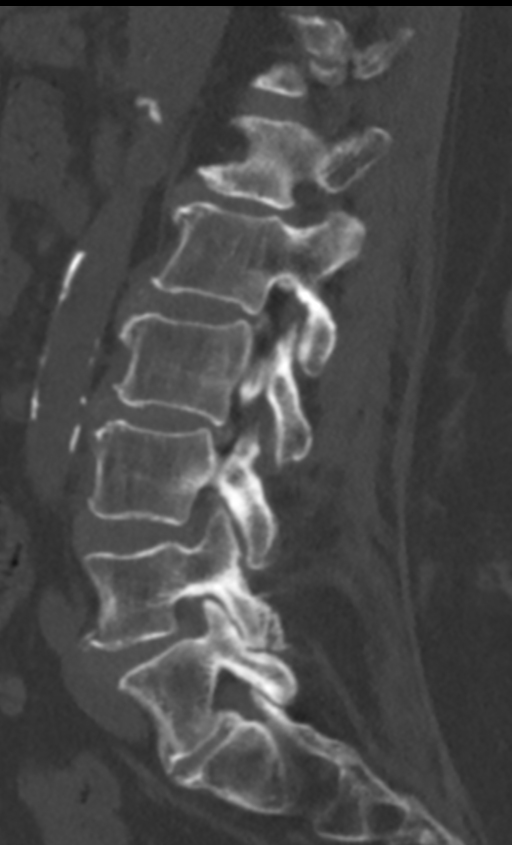

[11 of 33 positions shown; findings below may reference images not displayed]

FINDINGS: LUMBAR MYELOGRAM FINDINGS:

Levoscoliosis that worsens with standing. Scoliosis measures 12
degrees from the superior endplate of L2 to the superior endplate of
L5.

Slight L3-4 retrolisthesis that does not significantly change with
flexion and extension.

CT LUMBAR MYELOGRAM FINDINGS:

Segmentation: There are 12 paired ribs on 03/11/2016 chest x-ray.
Based on the lowest ribs on this study there is a transitional S1
vertebra with incomplete segmentation of the left transverse process
from the sacrum. This numbering scheme differs from prior.

Alignment: Levoscoliosis as quantified above. Slight L2-3 and L3-4
retrolisthesis.

Vertebrae: Negative for fracture, discitis, or aggressive bone
lesion.

Paraspinal: Atherosclerotic calcification. Partially visualized
cholecystectomy clips.

Canal: Free-floating cauda equina. Normal appearance of the conus
which terminates at L1-2.

Disc levels:

T12- L1: Mild spondylosis.  No impingement.

L1-L2: Degenerative disc narrowing with vacuum phenomenon and disc
bulging. Mild endplate ridging. Negative for impingement.

L2-L3: Disc narrowing and bulging with annulus calcification. Slight
retrolisthesis. Mild right subarticular recess narrowing. No
compressive stenosis

L3-L4: Asymmetric advanced rightward disc collapse with far-lateral
spurring and bulging. Asymmetric right facet hypertrophy. Right
foraminal impingement. Right subarticular recess narrowing without
L4 compression. The left foramen is patent.

L4-L5: Mild disc bulging. Asymmetric left facet hypertrophy with
moderate left foraminal stenosis.

L5-S1:Asymmetric leftward disc narrowing. Disc bulging. Asymmetric
left facet spurring and hypertrophy. Moderate left foraminal
stenosis. Patent spinal canal.

S1-2: Transitional segment.  No degenerative changes or impingement.
IMPRESSION: 1. Twelve paired ribs and 6 lumbar type vertebral bodies. The lowest
vertebrae is numbered S1. This is a different numbering scheme than
used 04/16/2017.
2. Focal advanced L3-4 disc degeneration with right-sided collapse
and levoscoliosis.
3. Facet degeneration most notable on the left at L4-5.
4. L3-4 moderate right foraminal stenosis.
5. L4-5 and L5-S1 moderate left foraminal stenosis.
6. Diffusely patent spinal canal.

## 2020-02-11 ENCOUNTER — Ambulatory Visit (INDEPENDENT_AMBULATORY_CARE_PROVIDER_SITE_OTHER): Payer: PPO

## 2020-02-11 ENCOUNTER — Other Ambulatory Visit: Payer: Self-pay | Admitting: Family Medicine

## 2020-02-11 ENCOUNTER — Telehealth: Payer: Self-pay | Admitting: Family Medicine

## 2020-02-11 ENCOUNTER — Ambulatory Visit: Payer: PPO | Admitting: Family Medicine

## 2020-02-11 ENCOUNTER — Telehealth: Payer: Self-pay | Admitting: Cardiology

## 2020-02-11 ENCOUNTER — Encounter: Payer: Self-pay | Admitting: Family Medicine

## 2020-02-11 VITALS — BP 140/84 | HR 70 | Ht 64.0 in | Wt 134.0 lb

## 2020-02-11 DIAGNOSIS — Z8679 Personal history of other diseases of the circulatory system: Secondary | ICD-10-CM | POA: Diagnosis not present

## 2020-02-11 DIAGNOSIS — R42 Dizziness and giddiness: Secondary | ICD-10-CM | POA: Diagnosis not present

## 2020-02-11 DIAGNOSIS — Z87898 Personal history of other specified conditions: Secondary | ICD-10-CM

## 2020-02-11 DIAGNOSIS — R002 Palpitations: Secondary | ICD-10-CM

## 2020-02-11 MED ORDER — ATENOLOL 25 MG PO TABS
25.0000 mg | ORAL_TABLET | Freq: Two times a day (BID) | ORAL | 3 refills | Status: DC
Start: 2020-02-11 — End: 2020-03-24

## 2020-02-11 NOTE — Telephone Encounter (Signed)
Pre-cert Verification for the following procedure     14 day Zio XT order by Durward Mallard Pt

## 2020-02-11 NOTE — Progress Notes (Signed)
Cardiology Office Note  Date: 02/11/2020   ID: Tina Schultz, DOB 1945-07-01, MRN [id number]  PCP:  Caryl Bis, MD  Cardiologist:  Carlyle Dolly, MD Electrophysiologist:  None   Chief Complaint: Recent complaints of low blood pressures and headaches, feeling faint, weak.  History of Present Illness: Tina Schultz is a 75 y.o. female with a history of HTN, hypothyroidism, IBS, polymyalgia rheumatica, rheumatic fever, CVA, thyroid disease, ulcerative colitis, CAD, dementia, depression, DM2, fibromyalgia, GERD, glaucoma, asthma, anxiety, dysrhythmia.  Last encounter with Dr. Harl Bowie on 05/04/2019 complaint of palpitations.  She had been seen in the emergency room in February 2018 with palpitations.  She had symptoms which started 8 weeks prior to visit.  Described feeling of heart pounding, dizziness.  Occurring with rest or with exertion.  Could get diaphoretic.  Denied shortness of breath.  Lasting only few seconds.  Occurring several times a day.  Could awake her from the sleep.  She was drinking 2 cups of coffee daily.  Her atenolol has been decreased to 25 mg daily.  Was orthostatic in the clinic.  During her recent ER visit atenolol had been increased back to 50 mg daily.  Had a previously normal heart monitor in 2018.  Subsequent heart monitor rare SVT ventricular ectopy.  Rare short runs of atrial tachycardia.  Occasional palpitations since prior visit mild and short in duration.  At a prior clinic visit she had a 38 point drop-in systolic blood pressure was standing.  She was subsequently started on midodrine 2.5 mg p.o. 3 times daily.  She was on prednisone at the time for her polyneuralgia but dose was stable.  Patient states she has been recently having episodes of feeling faint for a brief episode. She states she was in a local store recently and had a sudden onset of feeling faint while she was walking around the store. States she had to stand still for about a minute and  this subsided. She states she has these episodes periodically. She called earlier this morning with complaints of recent low blood pressures, headaches feeling faint and weak. We performed orthostatic vital signs and there were no significant changes in heart rate. However when she went from a sitting to standing position her systolic dropped from 010 down to 932 mmHg systolic. She did not report any orthostatic symptoms during the vital sign check. She was wondering if she should go back up on her atenolol. She has had a couple of doses changes from 25 mg up to 50 and back down to 25 mg daily recently. She denies any recent acute illnesses or respiratory symptoms. We discussed her midodrine and initially she was not sure she was taking the midodrine or not. She eventually decided she had not been taking the midodrine and her blood pressure had been adequate. When she called this morning staff received listing from her of home blood pressures of; blood pressure on 02/07/2018 of 125/71 with heart rate of 72 sitting. Standing blood pressure was 98/65 with a pulse rate of 90. On 02/09/2020 her blood pressure sitting was 168/76 with a heart rate of 69. Standing blood pressure was 142/79 with a heart rate of 77. On 02/11/2020 her blood pressure sitting 137/84 with a heart rate of 77. Standing BP 107/77 with pulse of 97. She states she has an odd feeling in her head and some palpitations when these episodes occur.  Past Medical History:  Diagnosis Date  . Anxiety   . Asthma  asthmatic bronchitis  . Coronary artery disease   . Dementia ([place])    3rd stage dementia  . Depression   . Diabetes mellitus without complication ([place])   . Dysrhythmia   . Fibromyalgia   . GERD (gastroesophageal reflux disease)   . Glaucoma   . Hypertension   . Hypothyroidism   . IBS (irritable bowel syndrome)   . Polymyalgia rheumatica ([place])   . Rheumatic fever   . Stroke [facility] [place]   left sided weakness; memorey loss  .  Thyroid disease   . Ulcerative colitis ([place])    per patient diagnosed at age 22    Past Surgical History:  Procedure Laterality Date  . ABDOMINAL HYSTERECTOMY    . BIOPSY  06/03/2019   Procedure: BIOPSY;  Surgeon: Daneil Dolin, MD;  Location: [facility] ENDO SU[facility];  Service: Endoscopy;;  . CATARACT EXTRACTION W/PHACO Right 10/15/2016   Procedure: CATARACT EXTRACTION PHACO AND INTRAOCULAR LENS PLACEMENT ([place]);  Surgeon: Rutherford Guys, MD;  Location: [facility] ORS;  Service: Ophthalmology;  Laterality: Right;  CDE: 7.56  . CATARACT EXTRACTION W/PHACO Left 01/14/2017   Procedure: CATARACT EXTRACTION PHACO AND INTRAOCULAR LENS PLACEMENT ([facility]);  Surgeon: Rutherford Guys, MD;  Location: [facility] ORS;  Service: Ophthalmology;  Laterality: Left;  CDE: 6.28  . CHOLECYSTECTOMY    . COLONOSCOPY  2013   Dr. Britta Mccreedy: mild left-sided diverticulosis, multiple biopsies with path unremarkable.   . COLONOSCOPY WITH PROPOFOL N/A 06/03/2019   pancolonic diverticulosis, normal TI. S/p biopsies. Benign.   . ELBOW DEBRIDEMENT Right   . THYROID SURGERY     partial thyroidectomy age 55.    Current Outpatient Medications  Medication Sig Dispense Refill  . alendronate (FOSAMAX) 70 MG tablet Take 70 mg by mouth every Wednesday. Take with a full glass of water on an empty stomach.    Marland Kitchen atenolol (TENORMIN) 25 MG tablet Take 25 mg by mouth daily.    . Camphor-Menthol-Methyl Sal (SALONPAS) 3.02-10-08 % PTCH Apply 1 packet topically daily as needed (Back pain).    . cyclobenzaprine (FLEXERIL) 10 MG tablet Take 10 mg by mouth 3 (three) times daily as needed for muscle spasms.    . cycloSPORINE (RESTASIS) 0.05 % ophthalmic emulsion Place 1 drop into both eyes daily.    . diphenhydramine-acetaminophen (TYLENOL PM) 25-500 MG TABS tablet Take 1 tablet by mouth at bedtime.    Marland Kitchen estradiol (ESTRACE) 0.5 MG tablet Take 0.5 mg by mouth at bedtime.     . gabapentin (NEURONTIN) 600 MG tablet Take 600 mg by mouth 2 (two) times daily.     Marland Kitchen  HYDROcodone-acetaminophen (NORCO/VICODIN) 5-325 MG tablet Take 1 tablet by mouth every 6 (six) hours as needed for moderate pain.    Marland Kitchen latanoprost (XALATAN) 0.005 % ophthalmic solution Place 1 drop into both eyes at bedtime.    Marland Kitchen levothyroxine (SYNTHROID) 88 MCG tablet Take 88 mcg by mouth daily before breakfast.    . midodrine (PROAMATINE) 2.5 MG tablet TAKE 1 TABLET (2.5 MG TOTAL) BY MOUTH 2 (TWO) TIMES DAILY WITH A MEAL. (Patient taking differently: Take 2.5 mg by mouth daily.) 180 tablet 1  . omeprazole (PRILOSEC) 20 MG capsule Take 20 mg by mouth daily.    . predniSONE (DELTASONE) 5 MG tablet Take 5 mg by mouth daily.     . Probiotic Product (PROBIOTIC DAILY PO) Take by mouth daily.    . SUMAtriptan (IMITREX) 100 MG tablet Take 100 mg by mouth 2 (two) times daily as needed.    Marland Kitchen  venlafaxine (EFFEXOR) 75 MG tablet Take 75 mg by mouth daily.     No current facility-administered medications for this visit.   Facility-Administered Medications Ordered in Other Visits  Medication Dose Route Frequency Provider Last Rate Last Admin  . ondansetron (ZOFRAN) 4 mg in sodium chloride 0.9 % 50 mL IVPB  4 mg Intravenous Q6H PRN Ashok Pall, MD       Allergies:  Contrast media [iodinated diagnostic agents], Aspirin, Codeine, Morphine and related, Sulfa antibiotics, and Benadryl [diphenhydramine]   Social History: The patient  reports that she quit smoking about 30 years ago. Her smoking use included cigarettes. She has never used smokeless tobacco. She reports that she does not drink alcohol and does not use drugs.   Family History: The patient's family history includes Addison's disease in her sister; Alzheimer's disease in her brother; Cancer in her mother; Colon cancer in her niece; Stroke in her father.   ROS:  Please see the history of present illness. Otherwise, complete review of systems is positive for none.  All other systems are reviewed and negative.   Physical Exam: VS:  BP 140/84    Pulse 70   Ht 5' 4"  (1.626 m)   Wt 134 lb (60.8 kg)   SpO2 97%   BMI 23.00 kg/m , BMI Body mass index is 23 kg/m.  Wt Readings from Last 3 Encounters:  02/11/20 134 lb (60.8 kg)  01/04/20 136 lb 12.8 oz (62.1 kg)  06/03/19 136 lb (61.7 kg)    General: Patient appears comfortable at rest. Neck: Supple, no elevated JVP or carotid bruits, no thyromegaly. Lungs: Clear to auscultation, nonlabored breathing at rest. Cardiac: Regular rate and rhythm, no S3 or significant systolic murmur, no pericardial rub. Extremities: No pitting edema, distal pulses 2+. Skin: Warm and dry. Musculoskeletal: No kyphosis. Neuropsychiatric: Alert and oriented x3, affect grossly appropriate.  ECG:  An ECG dated 02/11/2020 was personally reviewed today and demonstrated:  Normal sinus rhythm rate of 69  Recent Labwork: 05/31/2019: BUN 20; Creatinine, Ser 0.78; Potassium 3.7; Sodium 138  No results found for: CHOL, TRIG, HDL, CHOLHDL, VLDL, LDLCALC, LDLDIRECT  Other Studies Reviewed Today:   48-hour Holter monitor 10/20/2018 Study Highlights   48 hr holter monitor  Rare supraventricular ectopy, primarily as PACs. Rare short runs of atrial tachycardia.  Rare ventricular ectopy, all in the form of isolated PVCs  Symptoms correlate with rare ectopy, no significant arrhythmias   04/2016 heart monitor  Telemetry tracings show normal sinus rhythm  No symptoms reported  No significant arrhythmias  Assessment and Plan:  1. Feeling faint   2. History of palpitations   3. History of orthostatic hypotension    1. Feeling faint Patient states today she has had some recent episodes of feeling faint which lasts somewhere around a minute in length. States she was recently shopping and suddenly had an odd feeling in her head and had to stand still for a minute before it subsided. She states she felt some palpitations prior. She states it did not exactly feel like she may pass out. She stated she felt a  little odd in her head. She denies any issues with blood sugar dropping. She has a diagnosis of diabetes but it is diet controlled. She is not on any oral antihyperglycemic medications.  2. History of palpitations Previous history of palpitations with 2 previous monitors. See results above. Please place a 14-day ZIO monitor on patient to check for significant arrhythmias. Advised patient to take an  additional half pill of atenolol in addition to her regular atenolol 25 mg daily if she has another recurrence to see if this will help with the palpitations.  3. History of orthostatic hypotension She reports some fluctuations in blood pressures recently with above-mentioned home blood pressure results in HPI section. Orthostatic vital signs checked today in clinic. Initial lying blood pressure was 162/80 with a heart rate of 67. Sitting blood pressure 164/84 with a heart rate of 71. Standing blood pressure 140/76 with heart rate of 70. Patient initially stated she was taking her midodrine twice a day and was told by Dr. Olena Heckle to only take it once a day. Later during the conversation she states she has not taken any midodrine recently.  Medication Adjustments/Labs and Tests Ordered: Current medicines are reviewed at length with the patient today.  Concerns regarding medicines are outlined above.   Disposition: Follow-up with Dr. Harl Bowie or [facility] 6 weeks  Signed, Levell July, NP 02/11/2020 10:19 AM    [facility] [place], [place], [place] [address] Phone: [phone]; Fax: [phone]

## 2020-02-11 NOTE — Telephone Encounter (Signed)
Apt made for today

## 2020-02-11 NOTE — Telephone Encounter (Signed)
Pt c/o BP issue:  1. What are your last 5 BP readings?   02/08/2020 125/71 p 72  Sitting  98/65 P 90 Standing 02/09/2020  168/76 P 69 Sitting  142/79 P 77 Standing 02/11/2020  137/84 P 77 Sitting   107/77 P 97 Standing      2. Are you having any other symptoms (ex. Dizziness, headache, blurred vision, passed out)?  Patient has migraines  Patient states that she had a spell the other day became very faint feeling, weak . She is requesting an appointment.   3. What is your medication issue?

## 2020-02-11 NOTE — Patient Instructions (Signed)
Medication Instructions:  INCREASE Atenolol to 25 mg twice a day  *If you need a refill on your cardiac medications before your next appointment, please call your pharmacy*   Lab Work: nopne today If you have labs (blood work) drawn today and your tests are completely normal, you will receive your results only by: Marland Kitchen MyChart Message (if you have MyChart) OR . A paper copy in the mail If you have any lab test that is abnormal or we need to change your treatment, we will call you to review the results.   Testing/Procedures: Your physician has recommended that you wear an event monitor (ZIO for 14 days) . Event monitors are medical devices that record the heart's electrical activity. Doctors most often [place] these monitors to diagnose arrhythmias. Arrhythmias are problems with the speed or rhythm of the heartbeat. The monitor is a small, portable device. You can wear one while you do your normal daily activities. This is usually used to diagnose what is causing palpitations/syncope (passing out).     Follow-Up: At [facility], you and your health needs are our priority.  As part of our continuing mission to provide you with exceptional heart care, we have created designated Provider Care Teams.  These Care Teams include your primary Cardiologist (physician) and Advanced Practice Providers (APPs -  Physician Assistants and Nurse Practitioners) who all work together to provide you with the care you need, when you need it.  We recommend signing up for the patient portal called "MyChart".  Sign up information is provided on this After Visit Summary.  MyChart is used to connect with patients for Virtual Visits (Telemedicine).  Patients are able to view lab/test results, encounter notes, upcoming appointments, etc.  Non-urgent messages can be sent to your provider as well.   To learn more about what you can do with MyChart, go to [email or web address].    Your next appointment:   6 week(s)  The  format for your next appointment:   In Person  Provider:   Katina Dung, NP   Other Instructions None      Thank you for choosing [facility] !

## 2020-02-24 ENCOUNTER — Ambulatory Visit: Payer: PPO | Admitting: *Deleted

## 2020-02-24 VITALS — BP 114/62 | HR 69

## 2020-02-24 DIAGNOSIS — Z8679 Personal history of other diseases of the circulatory system: Secondary | ICD-10-CM

## 2020-02-24 NOTE — Progress Notes (Signed)
Patient requested to come by office for nurse to check her BP this evening.   BP 114/62  69  - sitting in chair   BP standing at pt request - 94/60   Suggested that she continue to monitor readings at home & if anything changed to let us know.   Patient does have follow up scheduled for 03/24/20 with Amie Critchley in the [place] office.

## 2020-02-28 ENCOUNTER — Ambulatory Visit: Payer: PPO

## 2020-03-01 DIAGNOSIS — R002 Palpitations: Secondary | ICD-10-CM | POA: Diagnosis not present

## 2020-03-04 DIAGNOSIS — I10 Essential (primary) hypertension: Secondary | ICD-10-CM | POA: Diagnosis not present

## 2020-03-04 DIAGNOSIS — E039 Hypothyroidism, unspecified: Secondary | ICD-10-CM | POA: Diagnosis not present

## 2020-03-04 DIAGNOSIS — E1165 Type 2 diabetes mellitus with hyperglycemia: Secondary | ICD-10-CM | POA: Diagnosis not present

## 2020-03-04 DIAGNOSIS — M4807 Spinal stenosis, lumbosacral region: Secondary | ICD-10-CM | POA: Diagnosis not present

## 2020-03-07 ENCOUNTER — Telehealth: Payer: Self-pay | Admitting: *Deleted

## 2020-03-07 NOTE — Telephone Encounter (Signed)
Laurine Blazer, LPN  04/05/1214 2:44 PM EST Back to Top     Left message to return call.

## 2020-03-07 NOTE — Telephone Encounter (Signed)
-----   Message from Verta Ellen., NP sent at 03/06/2020 12:57 PM EST ----- Please let the patient know she had some short runs of supraventricular tachycardia but no symptoms were reported.  Predominantly she was in sinus rhythm

## 2020-03-08 NOTE — Telephone Encounter (Signed)
Pt returning call

## 2020-03-08 NOTE — Telephone Encounter (Signed)
Pt voiced understanding - says she is still having the same symptoms but not any worse - has f/u 2/18 and will contact us if symptoms worsen

## 2020-03-20 DIAGNOSIS — Z1231 Encounter for screening mammogram for malignant neoplasm of breast: Secondary | ICD-10-CM | POA: Diagnosis not present

## 2020-03-23 NOTE — Progress Notes (Addendum)
Cardiology Office Note  Date: 03/24/2020   ID: Tina Schultz, DOB 09-24-1945, MRN [id number]  PCP:  Caryl Bis, MD  Cardiologist:  Carlyle Dolly, MD Electrophysiologist:  None   Chief Complaint: Recent complaints of low blood pressures and headaches, feeling faint, weak.  History of Present Illness: Tina Schultz is a 75 y.o. female with a history of HTN, hypothyroidism, IBS, polymyalgia rheumatica, rheumatic fever, CVA, thyroid disease, ulcerative colitis, CAD, dementia, depression, DM2, fibromyalgia, GERD, glaucoma, asthma, anxiety, dysrhythmia.  Previous encounter with Dr. Harl Bowie on 05/04/2019 complaint of palpitations.  She had been seen in the emergency room in February 2018 with palpitations.  She had symptoms which started 8 weeks prior to visit.  Described feeling of heart pounding, dizziness.  Occurring with rest or with exertion.  Could get diaphoretic.  Denied shortness of breath.  Lasting only few seconds.  Occurring several times a day.  Could awake her from the sleep.  She was drinking 2 cups of coffee daily.  Her atenolol has been decreased to 25 mg daily.  Was orthostatic in the clinic.  During her recent ER visit atenolol had been increased back to 50 mg daily.  Had a previously normal heart monitor in 2018.  Subsequent heart monitor rare SVT ventricular ectopy.  Rare short runs of atrial tachycardia.  Occasional palpitations since prior visit mild and short in duration.  At a prior clinic visit she had a 38 point drop-in systolic blood pressure with standing.  She was subsequently started on midodrine 2.5 mg p.o. 3 times daily.  She was on prednisone at the time for her polyneuralgia but dose was stable.  She is here today for 50-monthfollow-up.  She states her palpitations are better and less frequent and short-lived.  Otherwise she denies any anginal or exertional symptoms, palpitations or arrhythmias.  Recent repeat cardiac monitor On February 11, 2020 demonstrated  predominantly sinus rhythm.  Rare supraventricular ectopy, occasional ventricular ectopy, short runs of SVT up to 15 beats.  No symptoms reported.  Denies any CVA or TIA-like symptoms, PND, orthopnea, bleeding.  No presyncopal or syncopal episodes.  No bleeding issues.  No claudication-like symptoms, DVT or PE-like symptoms, or lower extremity edema.  Currently not taking midodrine.   Past Medical History:  Diagnosis Date  . Anxiety   . Asthma    asthmatic bronchitis  . Coronary artery disease   . Dementia ([place]    3rd stage dementia  . Depression   . Diabetes mellitus without complication ([place]   . Dysrhythmia   . Fibromyalgia   . GERD (gastroesophageal reflux disease)   . Glaucoma   . Hypertension   . Hypothyroidism   . IBS (irritable bowel syndrome)   . Polymyalgia rheumatica ([place]   . Rheumatic fever   . [place]  left sided weakness; memorey loss  . Thyroid disease   . Ulcerative colitis ([place]    per patient diagnosed at age 75   Past Surgical History:  Procedure Laterality Date  . ABDOMINAL HYSTERECTOMY    . BIOPSY  06/03/2019   Procedure: BIOPSY;  Surgeon: RDaneil Dolin MD;  [facility];  Service: Endoscopy;;  . CATARACT EXTRACTION W/PHACO Right 10/15/2016   Procedure: CATARACT EXTRACTION PHACO AND INTRAOCULAR LENS PLACEMENT ([place];  Surgeon: SRutherford Guys MD;  Location: [facility] ORS;  Service: Ophthalmology;  Laterality: Right;  CDE: 7.56  . CATARACT EXTRACTION W/PHACO Left 01/14/2017   Procedure: CATARACT EXTRACTION  PHACO AND INTRAOCULAR LENS PLACEMENT (IOC);  Surgeon: Rutherford Guys, MD;  Location: [facility] ORS;  Service: Ophthalmology;  Laterality: Left;  CDE: 6.28  . CHOLECYSTECTOMY    . COLONOSCOPY  2013   Dr. Britta Mccreedy: mild left-sided diverticulosis, multiple biopsies with path unremarkable.   . COLONOSCOPY WITH PROPOFOL N/A 06/03/2019   pancolonic diverticulosis, normal TI. S/p biopsies. Benign.   . ELBOW DEBRIDEMENT Right   . THYROID SURGERY      partial thyroidectomy age 61.    Current Outpatient Medications  Medication Sig Dispense Refill  . alendronate (FOSAMAX) 70 MG tablet Take 70 mg by mouth every Wednesday. Take with a full glass of water on an empty stomach.    Marland Kitchen atenolol (TENORMIN) 25 MG tablet Take 25 mg by mouth daily.    . Camphor-Menthol-Methyl Sal (SALONPAS) 3.02-10-08 % PTCH Apply 1 packet topically daily as needed (Back pain).    . cycloSPORINE (RESTASIS) 0.05 % ophthalmic emulsion Place 1 drop into both eyes daily.    . diphenhydramine-acetaminophen (TYLENOL PM) 25-500 MG TABS tablet Take 1 tablet by mouth at bedtime.    Marland Kitchen estradiol (ESTRACE) 0.5 MG tablet Take 0.5 mg by mouth at bedtime.     . gabapentin (NEURONTIN) 600 MG tablet Take 600 mg by mouth 2 (two) times daily.     Marland Kitchen HYDROcodone-acetaminophen (NORCO/VICODIN) 5-325 MG tablet Take 1 tablet by mouth every 6 (six) hours as needed for moderate pain.    Marland Kitchen latanoprost (XALATAN) 0.005 % ophthalmic solution Place 1 drop into both eyes at bedtime.    Marland Kitchen levothyroxine (SYNTHROID) 88 MCG tablet Take 88 mcg by mouth daily before breakfast.    . omeprazole (PRILOSEC) 20 MG capsule Take 20 mg by mouth daily.    . predniSONE (DELTASONE) 5 MG tablet Take 5 mg by mouth daily.     . Probiotic Product (PROBIOTIC DAILY PO) Take by mouth daily.    . SUMAtriptan (IMITREX) 100 MG tablet Take 100 mg by mouth 2 (two) times daily as needed.    . venlafaxine (EFFEXOR) 75 MG tablet Take 75 mg by mouth daily.     No current facility-administered medications for this visit.   Facility-Administered Medications Ordered in Other Visits  Medication Dose Route Frequency Provider Last Rate Last Admin  . ondansetron (ZOFRAN) 4 mg in sodium chloride 0.9 % 50 mL IVPB  4 mg Intravenous Q6H PRN Ashok Pall, MD       Allergies:  Contrast media [iodinated diagnostic agents], Aspirin, Codeine, Morphine and related, Sulfa antibiotics, and Benadryl [diphenhydramine]   Social History: The patient   reports that she quit smoking about 30 years ago. Her smoking use included cigarettes. She has never used smokeless tobacco. She reports that she does not drink alcohol and does not use drugs.   Family History: The patient's family history includes Addison's disease in her sister; Alzheimer's disease in her brother; Cancer in her mother; Colon cancer in her niece; Stroke in her father.   ROS:  Please see the history of present illness. Otherwise, complete review of systems is positive for none.  All other systems are reviewed and negative.   Physical Exam: VS:  BP 115/72   Pulse (!) 57   Ht 5' 4"  (1.626 m)   Wt 138 lb 12.8 oz (63 kg)   SpO2 98%   BMI 23.82 kg/m , BMI Body mass index is 23.82 kg/m.  Wt Readings from Last 3 Encounters:  03/24/20 138 lb 12.8 oz (63 kg)  02/11/20  134 lb (60.8 kg)  01/04/20 136 lb 12.8 oz (62.1 kg)    General: Patient appears comfortable at rest. Neck: Supple, no elevated JVP or carotid bruits, no thyromegaly. Lungs: Clear to auscultation, nonlabored breathing at rest. Cardiac: Regular rate and rhythm, no S3 or significant systolic murmur, no pericardial rub. Extremities: No pitting edema, distal pulses 2+. Skin: Warm and dry. Musculoskeletal: No kyphosis. Neuropsychiatric: Alert and oriented x3, affect grossly appropriate.  ECG:  An ECG dated 02/11/2020 was personally reviewed today and demonstrated:  Normal sinus rhythm rate of 69  Recent Labwork: 05/31/2019: BUN 20; Creatinine, Ser 0.78; Potassium 3.7; Sodium 138  No results found for: CHOL, TRIG, HDL, CHOLHDL, VLDL, LDLCALC, LDLDIRECT  Other Studies Reviewed Today:   Zio monitor 02/11/2020 Study Highlights    Predominant rhythm is sinus rhythm  Rare supraventicular ectopy, occasional ventriculare ectopy. Short runs of SVT up to 15 beats.  No symptoms reported      48-hour Holter monitor 10/20/2018 Study Highlights   48 hr holter monitor  Rare supraventricular ectopy, primarily as  PACs. Rare short runs of atrial tachycardia.  Rare ventricular ectopy, all in the form of isolated PVCs  Symptoms correlate with rare ectopy, no significant arrhythmias   04/2016 heart monitor  Telemetry tracings show normal sinus rhythm  No symptoms reported  No significant arrhythmias  Assessment and Plan:   1. Feeling faint States symptoms of feeling faint have gotten better.  States occasionally feels a transient dizziness which is short-lived.  No syncopal or near syncopal episodes.  2. History of palpitations Today patient states she experiencing less episodes of palpitations although they still occur.  They are much less frequent and occurrence and shorter in duration without any bothersome symptoms.  Continue atenolol 25 mg p.o. daily.  Recent cardiac monitor showed predominantly sinus rhythm.  Occasional supraventricular ectopy, occasional ventricular ectopy, short runs of SVT up to 15 beats.  No symptoms were recorded  3. History of orthostatic hypotension Reports stable stable blood pressures recently.  Today's blood pressure 115/72.  Heart rate of 57.  Continue atenolol 25 mg p.o. daily.  Currently not taking midodrine.  Medication Adjustments/Labs and Tests Ordered: Current medicines are reviewed at length with the patient today.  Concerns regarding medicines are outlined above.   Disposition: Follow-up with Dr. Harl Bowie or APP 6 months  Signed, Levell July, NP 03/24/2020 10:33 AM    [facility] [address], [place], [place] [address] Phone: [phone]; Fax: [phone]

## 2020-03-24 ENCOUNTER — Ambulatory Visit: Payer: PPO | Admitting: Family Medicine

## 2020-03-24 ENCOUNTER — Other Ambulatory Visit: Payer: Self-pay

## 2020-03-24 ENCOUNTER — Encounter: Payer: Self-pay | Admitting: Family Medicine

## 2020-03-24 VITALS — BP 115/72 | HR 57 | Ht 64.0 in | Wt 138.8 lb

## 2020-03-24 DIAGNOSIS — Z8679 Personal history of other diseases of the circulatory system: Secondary | ICD-10-CM | POA: Diagnosis not present

## 2020-03-24 DIAGNOSIS — Z87898 Personal history of other specified conditions: Secondary | ICD-10-CM | POA: Diagnosis not present

## 2020-03-24 DIAGNOSIS — R42 Dizziness and giddiness: Secondary | ICD-10-CM

## 2020-03-24 NOTE — Patient Instructions (Signed)
Medication Instructions:  Continue all current medications.   Labwork: none  Testing/Procedures: none  Follow-Up: 6 months   Any Other Special Instructions Will Be Listed Below (If Applicable).   If you need a refill on your cardiac medications before your next appointment, please call your pharmacy.  

## 2020-03-27 DIAGNOSIS — H401131 Primary open-angle glaucoma, bilateral, mild stage: Secondary | ICD-10-CM | POA: Diagnosis not present

## 2020-03-27 DIAGNOSIS — H04123 Dry eye syndrome of bilateral lacrimal glands: Secondary | ICD-10-CM | POA: Diagnosis not present

## 2020-04-03 DIAGNOSIS — E039 Hypothyroidism, unspecified: Secondary | ICD-10-CM | POA: Diagnosis not present

## 2020-04-03 DIAGNOSIS — I10 Essential (primary) hypertension: Secondary | ICD-10-CM | POA: Diagnosis not present

## 2020-04-03 DIAGNOSIS — M4807 Spinal stenosis, lumbosacral region: Secondary | ICD-10-CM | POA: Diagnosis not present

## 2020-04-03 DIAGNOSIS — E1165 Type 2 diabetes mellitus with hyperglycemia: Secondary | ICD-10-CM | POA: Diagnosis not present

## 2020-05-03 DIAGNOSIS — E1165 Type 2 diabetes mellitus with hyperglycemia: Secondary | ICD-10-CM | POA: Diagnosis not present

## 2020-05-03 DIAGNOSIS — M4807 Spinal stenosis, lumbosacral region: Secondary | ICD-10-CM | POA: Diagnosis not present

## 2020-05-03 DIAGNOSIS — E039 Hypothyroidism, unspecified: Secondary | ICD-10-CM | POA: Diagnosis not present

## 2020-05-03 DIAGNOSIS — I10 Essential (primary) hypertension: Secondary | ICD-10-CM | POA: Diagnosis not present

## 2020-05-06 DIAGNOSIS — J0191 Acute recurrent sinusitis, unspecified: Secondary | ICD-10-CM | POA: Diagnosis not present

## 2020-05-06 DIAGNOSIS — K5100 Ulcerative (chronic) pancolitis without complications: Secondary | ICD-10-CM | POA: Diagnosis not present

## 2020-05-06 DIAGNOSIS — I10 Essential (primary) hypertension: Secondary | ICD-10-CM | POA: Diagnosis not present

## 2020-05-09 DIAGNOSIS — M26622 Arthralgia of left temporomandibular joint: Secondary | ICD-10-CM | POA: Diagnosis not present

## 2020-05-11 DIAGNOSIS — M353 Polymyalgia rheumatica: Secondary | ICD-10-CM | POA: Diagnosis not present

## 2020-05-11 DIAGNOSIS — Z7952 Long term (current) use of systemic steroids: Secondary | ICD-10-CM | POA: Diagnosis not present

## 2020-05-11 DIAGNOSIS — R634 Abnormal weight loss: Secondary | ICD-10-CM | POA: Diagnosis not present

## 2020-05-11 DIAGNOSIS — M26609 Unspecified temporomandibular joint disorder, unspecified side: Secondary | ICD-10-CM | POA: Diagnosis not present

## 2020-05-11 DIAGNOSIS — M2550 Pain in unspecified joint: Secondary | ICD-10-CM | POA: Diagnosis not present

## 2020-05-11 DIAGNOSIS — M8589 Other specified disorders of bone density and structure, multiple sites: Secondary | ICD-10-CM | POA: Diagnosis not present

## 2020-05-11 DIAGNOSIS — Z6822 Body mass index (BMI) 22.0-22.9, adult: Secondary | ICD-10-CM | POA: Diagnosis not present

## 2020-05-11 DIAGNOSIS — R5383 Other fatigue: Secondary | ICD-10-CM | POA: Diagnosis not present

## 2020-05-11 DIAGNOSIS — R159 Full incontinence of feces: Secondary | ICD-10-CM | POA: Diagnosis not present

## 2020-05-11 DIAGNOSIS — M797 Fibromyalgia: Secondary | ICD-10-CM | POA: Diagnosis not present

## 2020-05-22 DIAGNOSIS — J069 Acute upper respiratory infection, unspecified: Secondary | ICD-10-CM | POA: Diagnosis not present

## 2020-06-07 DIAGNOSIS — J309 Allergic rhinitis, unspecified: Secondary | ICD-10-CM | POA: Diagnosis not present

## 2020-06-07 DIAGNOSIS — G43909 Migraine, unspecified, not intractable, without status migrainosus: Secondary | ICD-10-CM | POA: Diagnosis not present

## 2020-06-07 DIAGNOSIS — M26609 Unspecified temporomandibular joint disorder, unspecified side: Secondary | ICD-10-CM | POA: Diagnosis not present

## 2020-06-28 DIAGNOSIS — M2550 Pain in unspecified joint: Secondary | ICD-10-CM | POA: Diagnosis not present

## 2020-06-28 DIAGNOSIS — M797 Fibromyalgia: Secondary | ICD-10-CM | POA: Diagnosis not present

## 2020-06-28 DIAGNOSIS — R159 Full incontinence of feces: Secondary | ICD-10-CM | POA: Diagnosis not present

## 2020-06-28 DIAGNOSIS — R634 Abnormal weight loss: Secondary | ICD-10-CM | POA: Diagnosis not present

## 2020-06-28 DIAGNOSIS — M353 Polymyalgia rheumatica: Secondary | ICD-10-CM | POA: Diagnosis not present

## 2020-06-28 DIAGNOSIS — M8589 Other specified disorders of bone density and structure, multiple sites: Secondary | ICD-10-CM | POA: Diagnosis not present

## 2020-06-28 DIAGNOSIS — Z6823 Body mass index (BMI) 23.0-23.9, adult: Secondary | ICD-10-CM | POA: Diagnosis not present

## 2020-06-28 DIAGNOSIS — R5383 Other fatigue: Secondary | ICD-10-CM | POA: Diagnosis not present

## 2020-06-28 DIAGNOSIS — Z7952 Long term (current) use of systemic steroids: Secondary | ICD-10-CM | POA: Diagnosis not present

## 2020-06-30 DIAGNOSIS — E039 Hypothyroidism, unspecified: Secondary | ICD-10-CM | POA: Diagnosis not present

## 2020-06-30 DIAGNOSIS — E1165 Type 2 diabetes mellitus with hyperglycemia: Secondary | ICD-10-CM | POA: Diagnosis not present

## 2020-06-30 DIAGNOSIS — Z7189 Other specified counseling: Secondary | ICD-10-CM | POA: Diagnosis not present

## 2020-06-30 DIAGNOSIS — I10 Essential (primary) hypertension: Secondary | ICD-10-CM | POA: Diagnosis not present

## 2020-06-30 DIAGNOSIS — M353 Polymyalgia rheumatica: Secondary | ICD-10-CM | POA: Diagnosis not present

## 2020-06-30 DIAGNOSIS — K5190 Ulcerative colitis, unspecified, without complications: Secondary | ICD-10-CM | POA: Diagnosis not present

## 2020-06-30 DIAGNOSIS — E1169 Type 2 diabetes mellitus with other specified complication: Secondary | ICD-10-CM | POA: Diagnosis not present

## 2020-06-30 DIAGNOSIS — E8881 Metabolic syndrome: Secondary | ICD-10-CM | POA: Diagnosis not present

## 2020-07-13 DIAGNOSIS — R5383 Other fatigue: Secondary | ICD-10-CM | POA: Diagnosis not present

## 2020-07-13 DIAGNOSIS — M8589 Other specified disorders of bone density and structure, multiple sites: Secondary | ICD-10-CM | POA: Diagnosis not present

## 2020-07-13 DIAGNOSIS — M797 Fibromyalgia: Secondary | ICD-10-CM | POA: Diagnosis not present

## 2020-07-13 DIAGNOSIS — Z7952 Long term (current) use of systemic steroids: Secondary | ICD-10-CM | POA: Diagnosis not present

## 2020-07-13 DIAGNOSIS — M353 Polymyalgia rheumatica: Secondary | ICD-10-CM | POA: Diagnosis not present

## 2020-07-13 DIAGNOSIS — Z6823 Body mass index (BMI) 23.0-23.9, adult: Secondary | ICD-10-CM | POA: Diagnosis not present

## 2020-07-13 DIAGNOSIS — M2550 Pain in unspecified joint: Secondary | ICD-10-CM | POA: Diagnosis not present

## 2020-07-13 DIAGNOSIS — R159 Full incontinence of feces: Secondary | ICD-10-CM | POA: Diagnosis not present

## 2020-07-13 DIAGNOSIS — R634 Abnormal weight loss: Secondary | ICD-10-CM | POA: Diagnosis not present

## 2020-08-03 DIAGNOSIS — E1165 Type 2 diabetes mellitus with hyperglycemia: Secondary | ICD-10-CM | POA: Diagnosis not present

## 2020-08-03 DIAGNOSIS — M4807 Spinal stenosis, lumbosacral region: Secondary | ICD-10-CM | POA: Diagnosis not present

## 2020-08-03 DIAGNOSIS — E039 Hypothyroidism, unspecified: Secondary | ICD-10-CM | POA: Diagnosis not present

## 2020-08-03 DIAGNOSIS — I10 Essential (primary) hypertension: Secondary | ICD-10-CM | POA: Diagnosis not present

## 2020-08-10 DIAGNOSIS — Z7952 Long term (current) use of systemic steroids: Secondary | ICD-10-CM | POA: Diagnosis not present

## 2020-08-10 DIAGNOSIS — M353 Polymyalgia rheumatica: Secondary | ICD-10-CM | POA: Diagnosis not present

## 2020-10-03 DIAGNOSIS — M353 Polymyalgia rheumatica: Secondary | ICD-10-CM | POA: Diagnosis not present

## 2020-10-03 DIAGNOSIS — M8589 Other specified disorders of bone density and structure, multiple sites: Secondary | ICD-10-CM | POA: Diagnosis not present

## 2020-10-03 DIAGNOSIS — M797 Fibromyalgia: Secondary | ICD-10-CM | POA: Diagnosis not present

## 2020-10-03 DIAGNOSIS — Z6823 Body mass index (BMI) 23.0-23.9, adult: Secondary | ICD-10-CM | POA: Diagnosis not present

## 2020-10-03 DIAGNOSIS — Z7952 Long term (current) use of systemic steroids: Secondary | ICD-10-CM | POA: Diagnosis not present

## 2020-10-04 ENCOUNTER — Encounter: Payer: Self-pay | Admitting: *Deleted

## 2020-10-04 ENCOUNTER — Encounter: Payer: Self-pay | Admitting: Cardiology

## 2020-10-04 ENCOUNTER — Ambulatory Visit: Payer: PPO | Admitting: Cardiology

## 2020-10-04 ENCOUNTER — Other Ambulatory Visit: Payer: Self-pay

## 2020-10-04 VITALS — BP 160/70 | HR 63 | Ht 64.0 in | Wt 139.6 lb

## 2020-10-04 DIAGNOSIS — E039 Hypothyroidism, unspecified: Secondary | ICD-10-CM | POA: Diagnosis not present

## 2020-10-04 DIAGNOSIS — R002 Palpitations: Secondary | ICD-10-CM | POA: Diagnosis not present

## 2020-10-04 DIAGNOSIS — I951 Orthostatic hypotension: Secondary | ICD-10-CM | POA: Diagnosis not present

## 2020-10-04 DIAGNOSIS — M4807 Spinal stenosis, lumbosacral region: Secondary | ICD-10-CM | POA: Diagnosis not present

## 2020-10-04 DIAGNOSIS — I10 Essential (primary) hypertension: Secondary | ICD-10-CM

## 2020-10-04 DIAGNOSIS — E1165 Type 2 diabetes mellitus with hyperglycemia: Secondary | ICD-10-CM | POA: Diagnosis not present

## 2020-10-04 MED ORDER — ATENOLOL 25 MG PO TABS: 37.5000 mg | ORAL_TABLET | Freq: Every day | ORAL | 2 refills | Status: DC

## 2020-10-04 NOTE — Patient Instructions (Addendum)
Medication Instructions:  Your physician has recommended you make the following change in your medication:  Increase Atenolol to 37.5 mg daily.  Continue all other medications the same.  Labwork: none  Testing/Procedures: none  Follow-Up: Your physician recommends that you schedule a follow-up appointment in: 6 months   Any Other Special Instructions Will Be Listed Below (If Applicable).  If you need a refill on your cardiac medications before your next appointment, please call your pharmacy.

## 2020-10-04 NOTE — Progress Notes (Signed)
Clinical Summary Tina Schultz is a 75 y.o.femaleseen today for follow up of the following medical problems.    1. Palpitations - seen in ER 03/28/16 with palpitations - symptoms started 8 weeks ago. Feeling of heart pounding, can feel dizzy. Occurs at rest or with exertion. Can get get diaphoretic. No SOB no chest pain. Lasts just a few seconds. Occurs several times a day. Can awake from sleep. No specific trigger - coffee x 2cups, no tea, no sodas, no energy drinks, no EtOH. Drinks 1 bottle of water. - she reports atenolol was decreased to 25 mg daily, orthostatic in clinic. During recent ER visit increased back to 84m daily. Symptopms have not imrpoved - TSH 1.167, K 3.3, Cr 0.63   - 04/2016 normal heart monitor  - 10/2018 heart monitor rare supraventriuclar and ventricular ectopy. Rare short runs atach. Min HR 54, Max HR 109, Avg HR 68   Jan 2022 monitor: Rare supraventicular ectopy, occasional ventriculare ectopy. Short runs of SVT up to 15 beats.  - ongoign symptoms, occurring daily.  - taking atenolol 2979mdaily.      2. CAD? - CAD is mentioned in her PMH. She unclear of this diagnosis - [facility] she reports she was told to be normal - no symptoms     3. Orthostatic hypotension - 10/2018 heart monitor rare supraventriuclar and ventricular ectopy. Rare short runs atach. Min HR 54, Max HR 109, Avg HR 68   - prior clinic visit 38 point drop in SBP with standing.  - last visit we started midodrine 2.79m84mid, however appears Rx put in for only bid - symptoms have improved, not resolved but at a level they are tolerable.     - at prior visit she reported she was no longer taking the midodrine.  - some ongoing orthostatic dizziness   4 polyneuraligia,  she is on prednisone.  - prednisone but stable dose. ]   5. HTN - home bp's 120s-160s/60s-80s   SH: her husband is [employer] traderlso a patient of mine   Past  Medical History:  Diagnosis Date   Anxiety    Asthma    asthmatic bronchitis   Coronary artery disease    Dementia ([place]  3rd stage dementia   Depression    Diabetes mellitus without complication ([facility]  Dysrhythmia    Fibromyalgia    GERD (gastroesophageal reflux disease)    Glaucoma    Hypertension    Hypothyroidism    IBS (irritable bowel syndrome)    Polymyalgia rheumatica (HCC)    Rheumatic fever    Stroke ([facility]  1993   left sided weakness; memorey loss   Thyroid disease    Ulcerative colitis ([place]CC[place]  per patient diagnosed at age 71 31  Allergies  Allergen Reactions   Contrast Media [Iodinated Diagnostic Agents] Nausea And Vomiting and Other (See Comments)    Increased heart rate and sweating "a long time ago."  01/25/14 pt states she "went out" after receiving iv contrast 25 yrs ago and [facility]   Aspirin Other (See Comments)    Bleeding precautions secondary to ulcerative colitis.   Codeine Nausea And Vomiting   Morphine And Related Nausea And Vomiting   Sulfa Antibiotics Nausea And Vomiting   Benadryl [Diphenhydramine] Other (See Comments)    Makes me feel like I'm  having a seizure.     Current Outpatient Medications  Medication Sig Dispense Refill   alendronate (FOSAMAX) 70 MG tablet Take 70 mg by mouth every Wednesday. Take with a full glass of water on an empty stomach.     atenolol (TENORMIN) 25 MG tablet Take 25 mg by mouth daily.     Camphor-Menthol-Methyl Sal (SALONPAS) 3.02-10-08 % PTCH Apply 1 packet topically daily as needed (Back pain).     cycloSPORINE (RESTASIS) 0.05 % ophthalmic emulsion Place 1 drop into both eyes daily.     diphenhydramine-acetaminophen (TYLENOL PM) 25-500 MG TABS tablet Take 1 tablet by mouth at bedtime.     estradiol (ESTRACE) 0.5 MG tablet Take 0.5 mg by mouth at bedtime.      gabapentin (NEURONTIN) 600 MG tablet Take 600 mg by mouth 2 (two) times daily.       HYDROcodone-acetaminophen (NORCO/VICODIN) 5-325 MG tablet Take 1 tablet by mouth every 6 (six) hours as needed for moderate pain.     latanoprost (XALATAN) 0.005 % ophthalmic solution Place 1 drop into both eyes at bedtime.     levothyroxine (SYNTHROID) 88 MCG tablet Take 88 mcg by mouth daily before breakfast.     omeprazole (PRILOSEC) 20 MG capsule Take 20 mg by mouth daily.     predniSONE (DELTASONE) 5 MG tablet Take 5 mg by mouth daily.      Probiotic Product (PROBIOTIC DAILY PO) Take by mouth daily.     SUMAtriptan (IMITREX) 100 MG tablet Take 100 mg by mouth 2 (two) times daily as needed.     venlafaxine (EFFEXOR) 75 MG tablet Take 75 mg by mouth daily.     No current facility-administered medications for this visit.   Facility-Administered Medications Ordered in Other Visits  Medication Dose Route Frequency Provider Last Rate Last Admin   ondansetron (ZOFRAN) 4 mg in sodium chloride 0.9 % 50 mL IVPB  4 mg Intravenous Q6H PRN Ashok Pall, MD         Past Surgical History:  Procedure Laterality Date   ABDOMINAL HYSTERECTOMY     BIOPSY  06/03/2019   Procedure: BIOPSY;  Surgeon: Daneil Dolin, MD;  [facility];  Service: Endoscopy;;   CATARACT EXTRACTION W/PHACO Right 10/15/2016   Procedure: CATARACT EXTRACTION PHACO AND INTRAOCULAR LENS PLACEMENT ([place]);  Surgeon: Rutherford Guys, MD;  Location: [facility] ORS;  Service: Ophthalmology;  Laterality: Right;  CDE: 7.56   CATARACT EXTRACTION W/PHACO Left 01/14/2017   Procedure: CATARACT EXTRACTION PHACO AND INTRAOCULAR LENS PLACEMENT (IOC);  Surgeon: Rutherford Guys, MD;  Location: [facility] ORS;  Service: Ophthalmology;  Laterality: Left;  CDE: 6.28   CHOLECYSTECTOMY     COLONOSCOPY  2013   Dr. Britta Mccreedy: mild left-sided diverticulosis, multiple biopsies with path unremarkable.    COLONOSCOPY WITH PROPOFOL N/A 06/03/2019   pancolonic diverticulosis, normal TI. S/p biopsies. Benign.    ELBOW DEBRIDEMENT Right    THYROID SURGERY     partial  thyroidectomy age 24.     Allergies  Allergen Reactions   Contrast Media [Iodinated Diagnostic Agents] Nausea And Vomiting and Other (See Comments)    Increased heart rate and sweating "a long time ago."  01/25/14 pt states she "went out" after receiving iv contrast 25 yrs ago and was [facility] by [facility] to never have it again   Aspirin Other (See Comments)    Bleeding precautions secondary to ulcerative colitis.   Codeine Nausea And Vomiting   Morphine And Related Nausea And Vomiting  Sulfa Antibiotics Nausea And Vomiting   Benadryl [Diphenhydramine] Other (See Comments)    Makes me feel like I'm having a seizure.      Family History  Problem Relation Age of Onset   Cancer Mother    Stroke Father    Addison's disease Sister    Alzheimer's disease Brother    Colon cancer Niece        deceased at 53      Social History Tina Schultz reports that she quit smoking about 30 years ago. Her smoking use included cigarettes. She has never used smokeless tobacco. Tina Schultz reports no history of alcohol use.   Review of Systems CONSTITUTIONAL: No weight loss, fever, chills, weakness or fatigue.  HEENT: Eyes: No visual loss, blurred vision, double vision or yellow sclerae.No hearing loss, sneezing, congestion, runny nose or sore throat.  SKIN: No rash or itching.  CARDIOVASCULAR: per hpi RESPIRATORY: No shortness of breath, cough or sputum.  GASTROINTESTINAL: No anorexia, nausea, vomiting or diarrhea. No abdominal pain or blood.  GENITOURINARY: No burning on urination, no polyuria NEUROLOGICAL: No headache, dizziness, syncope, paralysis, ataxia, numbness or tingling in the extremities. No change in bowel or bladder control.  MUSCULOSKELETAL: No muscle, back pain, joint pain or stiffness.  LYMPHATICS: No enlarged nodes. No history of splenectomy.  PSYCHIATRIC: No history of depression or anxiety.  ENDOCRINOLOGIC: No reports of sweating, cold or heat intolerance. No polyuria or  polydipsia.  Marland Kitchen   Physical Examination Today's Vitals   10/04/20 0948  BP: (!) 160/70  Pulse: 63  SpO2: 98%  Weight: 139 lb 9.6 oz (63.3 kg)  Height: 5' 4"  (1.626 m)   Body mass index is 23.96 kg/m.  Gen: resting comfortably, no acute distress HEENT: no scleral icterus, pupils equal round and reactive, no palptable cervical adenopathy,  CV: RRR, no m/r/g, no jvd Resp: Clear to auscultation bilaterally GI: abdomen is soft, non-tender, non-distended, normal bowel sounds, no hepatosplenomegaly MSK: extremities are warm, no edema.  Skin: warm, no rash Neuro:  no focal deficits Psych: appropriate affect   Diagnostic Studies  04/2016 heart monitor Telemetry tracings show normal sinus rhythm No symptoms reported No significant arrhythmias  Jan 2022 monitor Predominant rhythm is sinus rhythm Rare supraventicular ectopy, occasional ventriculare ectopy. Short runs of SVT up to 15 beats. No symptoms reported     Patch Wear Time:  12 days and 6 hours (2022-01-07T10:56:47-0500 to 2022-01-19T17:44:18-0500)   Patient had a min HR of 47 bpm, max HR of 176 bpm, and avg HR of 68 bpm. Predominant underlying rhythm was Sinus Rhythm. 50 Supraventricular Tachycardia runs occurred, the run with the fastest interval lasting 13 beats with a max rate of 176 bpm, the  longest lasting 15 beats with an avg rate of 115 bpm. Isolated SVEs were rare (<1.0%), SVE Couplets were rare (<1.0%), and SVE Triplets were rare (<1.0%). Isolated VEs were occasional (1.9%, 21004), VE Couplets were rare (<1.0%, 65), and VE Triplets were  rare (<1.0%, 1). Ventricular Bigeminy and Trigeminy were present.     Assessment and Plan  1. Palpitations - essentially benign ectopy on event monitor - ongoing symptoms, increase atenolol to 37.87m daily. Careful dosing given prior issues with orthostatic hypotension and dizziness.    2.Orthostatic hypotension - she stopped her midodrine prevouisly, symptoms reasonably  controlled with just aggressive hydration - continue to monitor.   3. HTN - elevated bp's, difficult management due to prior issues with orthostatic hypotension - follow with higher atenolol dosing, would avoid  being over aggressive with targets given orthostatic hypogension which remains somewhat symptomatic       Arnoldo Lenis, M.D.

## 2020-10-06 DIAGNOSIS — Z20828 Contact with and (suspected) exposure to other viral communicable diseases: Secondary | ICD-10-CM | POA: Diagnosis not present

## 2020-10-06 DIAGNOSIS — R059 Cough, unspecified: Secondary | ICD-10-CM | POA: Diagnosis not present

## 2020-10-23 DIAGNOSIS — Z961 Presence of intraocular lens: Secondary | ICD-10-CM | POA: Diagnosis not present

## 2020-10-23 DIAGNOSIS — H5203 Hypermetropia, bilateral: Secondary | ICD-10-CM | POA: Diagnosis not present

## 2020-10-23 DIAGNOSIS — E119 Type 2 diabetes mellitus without complications: Secondary | ICD-10-CM | POA: Diagnosis not present

## 2020-10-23 DIAGNOSIS — H401131 Primary open-angle glaucoma, bilateral, mild stage: Secondary | ICD-10-CM | POA: Diagnosis not present

## 2020-10-23 DIAGNOSIS — H524 Presbyopia: Secondary | ICD-10-CM | POA: Diagnosis not present

## 2020-12-12 ENCOUNTER — Encounter: Payer: Self-pay | Admitting: Internal Medicine

## 2021-01-02 DIAGNOSIS — F331 Major depressive disorder, recurrent, moderate: Secondary | ICD-10-CM | POA: Diagnosis not present

## 2021-01-02 DIAGNOSIS — M797 Fibromyalgia: Secondary | ICD-10-CM | POA: Diagnosis not present

## 2021-01-02 DIAGNOSIS — E039 Hypothyroidism, unspecified: Secondary | ICD-10-CM | POA: Diagnosis not present

## 2021-01-02 DIAGNOSIS — K2100 Gastro-esophageal reflux disease with esophagitis, without bleeding: Secondary | ICD-10-CM | POA: Diagnosis not present

## 2021-01-02 DIAGNOSIS — Z1212 Encounter for screening for malignant neoplasm of rectum: Secondary | ICD-10-CM | POA: Diagnosis not present

## 2021-01-02 DIAGNOSIS — E1169 Type 2 diabetes mellitus with other specified complication: Secondary | ICD-10-CM | POA: Diagnosis not present

## 2021-01-02 DIAGNOSIS — Z0001 Encounter for general adult medical examination with abnormal findings: Secondary | ICD-10-CM | POA: Diagnosis not present

## 2021-01-02 DIAGNOSIS — I10 Essential (primary) hypertension: Secondary | ICD-10-CM | POA: Diagnosis not present

## 2021-01-02 DIAGNOSIS — Z9189 Other specified personal risk factors, not elsewhere classified: Secondary | ICD-10-CM | POA: Diagnosis not present

## 2021-01-02 DIAGNOSIS — M353 Polymyalgia rheumatica: Secondary | ICD-10-CM | POA: Diagnosis not present

## 2021-01-02 DIAGNOSIS — E8881 Metabolic syndrome: Secondary | ICD-10-CM | POA: Diagnosis not present

## 2021-01-02 DIAGNOSIS — K5190 Ulcerative colitis, unspecified, without complications: Secondary | ICD-10-CM | POA: Diagnosis not present

## 2021-01-02 DIAGNOSIS — E1165 Type 2 diabetes mellitus with hyperglycemia: Secondary | ICD-10-CM | POA: Diagnosis not present

## 2021-01-02 DIAGNOSIS — Z23 Encounter for immunization: Secondary | ICD-10-CM | POA: Diagnosis not present

## 2021-01-03 DIAGNOSIS — M4807 Spinal stenosis, lumbosacral region: Secondary | ICD-10-CM | POA: Diagnosis not present

## 2021-01-03 DIAGNOSIS — E1165 Type 2 diabetes mellitus with hyperglycemia: Secondary | ICD-10-CM | POA: Diagnosis not present

## 2021-01-03 DIAGNOSIS — I10 Essential (primary) hypertension: Secondary | ICD-10-CM | POA: Diagnosis not present

## 2021-01-03 DIAGNOSIS — E1169 Type 2 diabetes mellitus with other specified complication: Secondary | ICD-10-CM | POA: Diagnosis not present

## 2021-01-03 DIAGNOSIS — E039 Hypothyroidism, unspecified: Secondary | ICD-10-CM | POA: Diagnosis not present

## 2021-01-15 DIAGNOSIS — J019 Acute sinusitis, unspecified: Secondary | ICD-10-CM | POA: Diagnosis not present

## 2021-02-13 DIAGNOSIS — M797 Fibromyalgia: Secondary | ICD-10-CM | POA: Diagnosis not present

## 2021-02-13 DIAGNOSIS — M8589 Other specified disorders of bone density and structure, multiple sites: Secondary | ICD-10-CM | POA: Diagnosis not present

## 2021-02-13 DIAGNOSIS — Z6824 Body mass index (BMI) 24.0-24.9, adult: Secondary | ICD-10-CM | POA: Diagnosis not present

## 2021-02-13 DIAGNOSIS — M353 Polymyalgia rheumatica: Secondary | ICD-10-CM | POA: Diagnosis not present

## 2021-02-13 DIAGNOSIS — Z7952 Long term (current) use of systemic steroids: Secondary | ICD-10-CM | POA: Diagnosis not present

## 2021-02-22 DIAGNOSIS — H401131 Primary open-angle glaucoma, bilateral, mild stage: Secondary | ICD-10-CM | POA: Diagnosis not present

## 2021-03-04 DIAGNOSIS — I10 Essential (primary) hypertension: Secondary | ICD-10-CM | POA: Diagnosis not present

## 2021-03-04 DIAGNOSIS — E039 Hypothyroidism, unspecified: Secondary | ICD-10-CM | POA: Diagnosis not present

## 2021-04-03 DIAGNOSIS — E1165 Type 2 diabetes mellitus with hyperglycemia: Secondary | ICD-10-CM | POA: Diagnosis not present

## 2021-04-03 DIAGNOSIS — I10 Essential (primary) hypertension: Secondary | ICD-10-CM | POA: Diagnosis not present

## 2021-04-05 ENCOUNTER — Encounter: Payer: Self-pay | Admitting: Cardiology

## 2021-04-05 ENCOUNTER — Encounter: Payer: Self-pay | Admitting: *Deleted

## 2021-04-05 ENCOUNTER — Ambulatory Visit: Payer: PPO | Admitting: Cardiology

## 2021-04-05 VITALS — BP 130/64 | HR 56 | Ht 64.0 in | Wt 139.6 lb

## 2021-04-05 DIAGNOSIS — I951 Orthostatic hypotension: Secondary | ICD-10-CM

## 2021-04-05 DIAGNOSIS — R002 Palpitations: Secondary | ICD-10-CM

## 2021-04-05 DIAGNOSIS — I10 Essential (primary) hypertension: Secondary | ICD-10-CM

## 2021-04-05 MED ORDER — ATENOLOL 25 MG PO TABS: 37.5000 mg | ORAL_TABLET | ORAL | 2 refills | Status: DC

## 2021-04-05 NOTE — Progress Notes (Signed)
Clinical Summary Tina Schultz is a 76 y.o.female seen today for follow up of the following medical problems.    1. Palpitations - seen in ER 03/28/16 with palpitations - symptoms started 8 weeks ago. Feeling of heart pounding, can feel dizzy. Occurs at rest or with exertion. Can get get diaphoretic. No SOB no chest pain. Lasts just a few seconds. Occurs several times a day. Can awake from sleep. No specific trigger - coffee x 2cups, no tea, no sodas, no energy drinks, no EtOH. Drinks 1 bottle of water. - she reports atenolol was decreased to 25 mg daily, orthostatic in clinic. During recent ER visit increased back to 65m daily. Symptopms have not imrpoved - TSH 1.167, K 3.3, Cr 0.63   - 04/2016 normal heart monitor  - 10/2018 heart monitor rare supraventriuclar and ventricular ectopy. Rare short runs atach. Min HR 54, Max HR 109, Avg HR 68   Jan 2022 monitor: Rare supraventicular ectopy, occasional ventriculare ectopy. Short runs of SVT up to 15 beats.   - symptoms improved but not resolved - most of symptoms at night of palpitations.       2. CAD? - CAD is mentioned in her PMH. She unclear of this diagnosis - [facility] she reports she was told to be normal - no symptoms     3. Orthostatic hypotension - 10/2018 heart monitor rare supraventriuclar and ventricular ectopy. Rare short runs atach. Min HR 54, Max HR 109, Avg HR 68   - prior clinic visit 38 point drop in SBP with standing.  - at prior visit she reported she was no longer taking the midodrine.  - some ongoing orthostatic dizziness   4 polyneuraligia,       5. HTN - compliant with meds   SH: her husband is [employer] trader also a patient of mine     Past Medical History:  Diagnosis Date   Anxiety    Asthma    asthmatic bronchitis   Coronary artery disease    Dementia ([facility]    3rd stage dementia   Depression    Diabetes mellitus without complication  ([place]    Dysrhythmia    Fibromyalgia    GERD (gastroesophageal reflux disease)    Glaucoma    Hypertension    Hypothyroidism    IBS (irritable bowel syndrome)    Polymyalgia rheumatica (HCC)    Rheumatic fever    Stroke ([place] 1991  1993   left sided weakness; memorey loss   Thyroid disease    Ulcerative colitis ([place]    per patient diagnosed at age 76    Allergies  Allergen Reactions   Contrast Media [Iodinated Contrast Media] Nausea And Vomiting and Other (See Comments)    Increased heart rate and sweating "a long time ago."  01/25/14 pt states she "went out" after receiving iv contrast 25 yrs ago [facility]   Aspirin Other (See Comments)    Bleeding precautions secondary to ulcerative colitis.   Codeine Nausea And Vomiting   Morphine And Related Nausea And Vomiting   Sulfa Antibiotics Nausea And Vomiting   Benadryl [Diphenhydramine] Other (See Comments)    Makes me feel like I'm having a seizure.     Current Outpatient Medications  Medication Sig Dispense Refill   alendronate (FOSAMAX) 70 MG tablet Take 70 mg by mouth every Wednesday. Take with a full  glass of water on an empty stomach.     atenolol (TENORMIN) 25 MG tablet Take 1.5 tablets (37.5 mg total) by mouth daily. 135 tablet 2   Azelastine HCl 137 MCG/SPRAY SOLN Place 1 spray into both nostrils 2 (two) times daily.     Camphor-Menthol-Methyl Sal (SALONPAS) 3.02-10-08 % PTCH Apply 1 packet topically daily as needed (Back pain).     cycloSPORINE (RESTASIS) 0.05 % ophthalmic emulsion Place 1 drop into both eyes daily.     diphenhydramine-acetaminophen (TYLENOL PM) 25-500 MG TABS tablet Take 1 tablet by mouth at bedtime.     estradiol (ESTRACE) 0.5 MG tablet Take 0.5 mg by mouth at bedtime.      gabapentin (NEURONTIN) 600 MG tablet Take 600 mg by mouth 2 (two) times daily.      HYDROcodone-acetaminophen (NORCO/VICODIN) 5-325 MG tablet Take 1 tablet by mouth every 6 (six)  hours as needed for moderate pain.     latanoprost (XALATAN) 0.005 % ophthalmic solution Place 1 drop into both eyes at bedtime.     levothyroxine (SYNTHROID) 88 MCG tablet Take 88 mcg by mouth daily before breakfast.     omeprazole (PRILOSEC) 20 MG capsule Take 20 mg by mouth daily.     predniSONE (DELTASONE) 5 MG tablet Take 5 mg by mouth daily.      Probiotic Product (PROBIOTIC DAILY PO) Take by mouth daily.     SUMAtriptan (IMITREX) 100 MG tablet Take 100 mg by mouth 2 (two) times daily as needed.     venlafaxine (EFFEXOR) 75 MG tablet Take 75 mg by mouth daily.     No current facility-administered medications for this visit.   Facility-Administered Medications Ordered in Other Visits  Medication Dose Route Frequency Provider Last Rate Last Admin   ondansetron (ZOFRAN) 4 mg in sodium chloride 0.9 % 50 mL IVPB  4 mg Intravenous Q6H PRN Ashok Pall, MD         Past Surgical History:  Procedure Laterality Date   ABDOMINAL HYSTERECTOMY     BIOPSY  06/03/2019   Procedure: BIOPSY;  Surgeon: Daneil Dolin, MD;  [facility];  Service: Endoscopy;;   CATARACT EXTRACTION W/PHACO Right 10/15/2016   Procedure: CATARACT EXTRACTION PHACO AND INTRAOCULAR LENS PLACEMENT ([place]);  Surgeon: Rutherford Guys, MD;  Location: [facility] OR[facility];  Service: Ophthalmology;  Laterality: Right;  CDE: 7.56   CATARACT EXTRACTION W/PHACO Left 01/14/2017   Procedure: CATARACT EXTRACTION PHACO AND INTRAOCULAR LENS PLACEMENT ([facility]);  Surgeon: Rutherford Guys, MD;  Location: [facility] ORS;  Service: Ophthalmology;  Laterality: Left;  CDE: 6.28   CHOLECYSTECTOMY     COLONOSCOPY  2013   Dr. Britta Mccreedy: mild left-sided diverticulosis, multiple biopsies with path unremarkable.    COLONOSCOPY WITH PROPOFOL N/A 06/03/2019   pancolonic diverticulosis, normal TI. S/p biopsies. Benign.    ELBOW DEBRIDEMENT Right    THYROID SURGERY     partial thyroidectomy age 66.     Allergies  Allergen Reactions   Contrast Media [Iodinated Contrast  Media] Nausea And Vomiting and Other (See Comments)    Increased heart rate and sweating "a long time ago."  01/25/14 pt states she "went out" after receiving iv contrast 25 yrs ago and was told by [facility] to never have it again   Aspirin Other (See Comments)    Bleeding precautions secondary to ulcerative colitis.   Codeine Nausea And Vomiting   Morphine And Related Nausea And Vomiting   Sulfa Antibiotics Nausea And Vomiting   Benadryl [Diphenhydramine] Other (  See Comments)    Makes me feel like I'm having a seizure.      Family History  Problem Relation Age of Onset   Cancer Mother    Stroke Father    Addison's disease Sister    Alzheimer's disease Brother    Colon cancer Niece        deceased at 44      Social History Ms. Bessinger reports that she quit smoking about 31 years ago. Her smoking use included cigarettes. She has never used smokeless tobacco. Ms. Mott reports no history of alcohol use.   Review of Systems CONSTITUTIONAL: No weight loss, fever, chills, weakness or fatigue.  HEENT: Eyes: No visual loss, blurred vision, double vision or yellow sclerae.No hearing loss, sneezing, congestion, runny nose or sore throat.  SKIN: No rash or itching.  CARDIOVASCULAR: per hpi RESPIRATORY: No shortness of breath, cough or sputum.  GASTROINTESTINAL: No anorexia, nausea, vomiting or diarrhea. No abdominal pain or blood.  GENITOURINARY: No burning on urination, no polyuria NEUROLOGICAL: per hpi MUSCULOSKELETAL: No muscle, back pain, joint pain or stiffness.  LYMPHATICS: No enlarged nodes. No history of splenectomy.  PSYCHIATRIC: No history of depression or anxiety.  ENDOCRINOLOGIC: No reports of sweating, cold or heat intolerance. No polyuria or polydipsia.  Marland Kitchen   Physical Examination Today's Vitals   04/05/21 1332  BP: 130/64  Pulse: (!) 56  SpO2: 99%  Weight: 139 lb 9.6 oz (63.3 kg)  Height: 5' 4"  (1.626 m)   Body mass index is 23.96 kg/m.  Gen: resting  comfortably, no acute distress HEENT: no scleral icterus, pupils equal round and reactive, no palptable cervical adenopathy,  CV: RRR, no m/r/g no jvd Resp: Clear to auscultation bilaterally GI: abdomen is soft, non-tender, non-distended, normal bowel sounds, no hepatosplenomegaly MSK: extremities are warm, no edema.  Skin: warm, no rash Neuro:  no focal deficits Psych: appropriate affect   Diagnostic Studies 04/2016 heart monitor Telemetry tracings show normal sinus rhythm No symptoms reported No significant arrhythmias   Jan 2022 monitor Predominant rhythm is sinus rhythm Rare supraventicular ectopy, occasional ventriculare ectopy. Short runs of SVT up to 15 beats. No symptoms reported     Patch Wear Time:  12 days and 6 hours (2022-01-07T10:56:47-0500 to 2022-01-19T17:44:18-0500)   Patient had a min HR of 47 bpm, max HR of 176 bpm, and avg HR of 68 bpm. Predominant underlying rhythm was Sinus Rhythm. 50 Supraventricular Tachycardia runs occurred, the run with the fastest interval lasting 13 beats with a max rate of 176 bpm, the  longest lasting 15 beats with an avg rate of 115 bpm. Isolated SVEs were rare (<1.0%), SVE Couplets were rare (<1.0%), and SVE Triplets were rare (<1.0%). Isolated VEs were occasional (1.9%, 21004), VE Couplets were rare (<1.0%, 65), and VE Triplets were  rare (<1.0%, 1). Ventricular Bigeminy and Trigeminy were present.     Assessment and Plan   1. Palpitations - symptoms improved but not resolved on higher atenolol dose. Dosing limited due to chronic orthostatic dizziness. Recent palpitations mainly at night, will change atenolol to 37.56m in AM and 12.510min PM.    - EKG todays shows sinus brady 56  2.Orthostatic hypotension - she stopped her midodrine prevouisly - chronic stable symptoms, could retry midodrine if progression. Encouraged continued hydration, sodium intake.    3. HTN -at goal, avoid aggressive management due to issues with  orthostatic hypotension   F/u 6 months   JoArnoldo LenisM.D.

## 2021-04-05 NOTE — Patient Instructions (Addendum)
Medication Instructions:  ?Your physician has recommended you make the following change in your medication:  ?Increase atenolol to 37.5 mg in the morning and 12.5 mg in the evening ?Continue other medications the same ? ?Labwork: ?none ? ?Testing/Procedures: ?none ? ?Follow-Up: ?Your physician recommends that you schedule a follow-up appointment in: 6 months ? ?Any Other Special Instructions Will Be Listed Below (If Applicable). ? ?If you need a refill on your cardiac medications before your next appointment, please call your pharmacy. ?

## 2021-04-11 DIAGNOSIS — M5416 Radiculopathy, lumbar region: Secondary | ICD-10-CM | POA: Diagnosis not present

## 2021-04-16 DIAGNOSIS — M353 Polymyalgia rheumatica: Secondary | ICD-10-CM | POA: Diagnosis not present

## 2021-04-16 DIAGNOSIS — M8589 Other specified disorders of bone density and structure, multiple sites: Secondary | ICD-10-CM | POA: Diagnosis not present

## 2021-04-16 DIAGNOSIS — Z7952 Long term (current) use of systemic steroids: Secondary | ICD-10-CM | POA: Diagnosis not present

## 2021-04-16 DIAGNOSIS — M797 Fibromyalgia: Secondary | ICD-10-CM | POA: Diagnosis not present

## 2021-04-16 DIAGNOSIS — M5136 Other intervertebral disc degeneration, lumbar region: Secondary | ICD-10-CM | POA: Diagnosis not present

## 2021-04-16 DIAGNOSIS — Z6824 Body mass index (BMI) 24.0-24.9, adult: Secondary | ICD-10-CM | POA: Diagnosis not present

## 2021-04-17 DIAGNOSIS — M48062 Spinal stenosis, lumbar region with neurogenic claudication: Secondary | ICD-10-CM | POA: Diagnosis not present

## 2021-04-17 DIAGNOSIS — M7061 Trochanteric bursitis, right hip: Secondary | ICD-10-CM | POA: Diagnosis not present

## 2021-05-02 DIAGNOSIS — E8881 Metabolic syndrome: Secondary | ICD-10-CM | POA: Diagnosis not present

## 2021-05-02 DIAGNOSIS — E1165 Type 2 diabetes mellitus with hyperglycemia: Secondary | ICD-10-CM | POA: Diagnosis not present

## 2021-05-02 DIAGNOSIS — M797 Fibromyalgia: Secondary | ICD-10-CM | POA: Diagnosis not present

## 2021-05-02 DIAGNOSIS — I10 Essential (primary) hypertension: Secondary | ICD-10-CM | POA: Diagnosis not present

## 2021-05-02 DIAGNOSIS — K5190 Ulcerative colitis, unspecified, without complications: Secondary | ICD-10-CM | POA: Diagnosis not present

## 2021-05-02 DIAGNOSIS — M353 Polymyalgia rheumatica: Secondary | ICD-10-CM | POA: Diagnosis not present

## 2021-05-02 DIAGNOSIS — Z23 Encounter for immunization: Secondary | ICD-10-CM | POA: Diagnosis not present

## 2021-05-02 DIAGNOSIS — E1169 Type 2 diabetes mellitus with other specified complication: Secondary | ICD-10-CM | POA: Diagnosis not present

## 2021-06-03 DIAGNOSIS — E1165 Type 2 diabetes mellitus with hyperglycemia: Secondary | ICD-10-CM | POA: Diagnosis not present

## 2021-06-03 DIAGNOSIS — E039 Hypothyroidism, unspecified: Secondary | ICD-10-CM | POA: Diagnosis not present

## 2021-06-03 DIAGNOSIS — I10 Essential (primary) hypertension: Secondary | ICD-10-CM | POA: Diagnosis not present

## 2021-06-03 DIAGNOSIS — M4807 Spinal stenosis, lumbosacral region: Secondary | ICD-10-CM | POA: Diagnosis not present

## 2021-06-25 ENCOUNTER — Telehealth: Payer: Self-pay | Admitting: Cardiology

## 2021-06-25 DIAGNOSIS — M25551 Pain in right hip: Secondary | ICD-10-CM | POA: Diagnosis not present

## 2021-06-25 DIAGNOSIS — M797 Fibromyalgia: Secondary | ICD-10-CM | POA: Diagnosis not present

## 2021-06-25 DIAGNOSIS — Z6824 Body mass index (BMI) 24.0-24.9, adult: Secondary | ICD-10-CM | POA: Diagnosis not present

## 2021-06-25 DIAGNOSIS — M353 Polymyalgia rheumatica: Secondary | ICD-10-CM | POA: Diagnosis not present

## 2021-06-25 DIAGNOSIS — M5136 Other intervertebral disc degeneration, lumbar region: Secondary | ICD-10-CM | POA: Diagnosis not present

## 2021-06-25 DIAGNOSIS — M25552 Pain in left hip: Secondary | ICD-10-CM | POA: Diagnosis not present

## 2021-06-25 DIAGNOSIS — M549 Dorsalgia, unspecified: Secondary | ICD-10-CM | POA: Diagnosis not present

## 2021-06-25 DIAGNOSIS — G8929 Other chronic pain: Secondary | ICD-10-CM | POA: Diagnosis not present

## 2021-06-25 MED ORDER — ATENOLOL 25 MG PO TABS: ORAL_TABLET | ORAL | 2 refills | Status: DC

## 2021-06-25 NOTE — Telephone Encounter (Signed)
Pt c/o medication issue:  1. Name of Medication: atenolol (TENORMIN) 25 MG tablet  2. How are you currently taking this medication (dosage and times per day)?   3. Are you having a reaction (difficulty breathing--STAT)?   4. What is your medication issue? Ally from [employer] called stating pt's Dr increased med dosage to 2x 25 mg daily and they need this updated in their system. Requesting new RX if this is the case.

## 2021-06-25 NOTE — Telephone Encounter (Signed)
Atenolol increased to 37.34m every morning and 12.540mevery evening at last OV on 04/05/2021 by Dr. BrHarl Bowie refill sent to [facility] as requested.

## 2021-07-04 DIAGNOSIS — E1165 Type 2 diabetes mellitus with hyperglycemia: Secondary | ICD-10-CM | POA: Diagnosis not present

## 2021-07-04 DIAGNOSIS — I10 Essential (primary) hypertension: Secondary | ICD-10-CM | POA: Diagnosis not present

## 2021-07-04 DIAGNOSIS — E039 Hypothyroidism, unspecified: Secondary | ICD-10-CM | POA: Diagnosis not present

## 2021-07-16 DIAGNOSIS — Z6824 Body mass index (BMI) 24.0-24.9, adult: Secondary | ICD-10-CM | POA: Diagnosis not present

## 2021-07-16 DIAGNOSIS — M5136 Other intervertebral disc degeneration, lumbar region: Secondary | ICD-10-CM | POA: Diagnosis not present

## 2021-07-17 DIAGNOSIS — H401131 Primary open-angle glaucoma, bilateral, mild stage: Secondary | ICD-10-CM | POA: Diagnosis not present

## 2021-07-25 DIAGNOSIS — M5416 Radiculopathy, lumbar region: Secondary | ICD-10-CM | POA: Diagnosis not present

## 2021-07-30 DIAGNOSIS — M5416 Radiculopathy, lumbar region: Secondary | ICD-10-CM | POA: Diagnosis not present

## 2021-09-03 DIAGNOSIS — I10 Essential (primary) hypertension: Secondary | ICD-10-CM | POA: Diagnosis not present

## 2021-09-03 DIAGNOSIS — E039 Hypothyroidism, unspecified: Secondary | ICD-10-CM | POA: Diagnosis not present

## 2021-09-03 DIAGNOSIS — E1165 Type 2 diabetes mellitus with hyperglycemia: Secondary | ICD-10-CM | POA: Diagnosis not present

## 2021-09-19 DIAGNOSIS — M48062 Spinal stenosis, lumbar region with neurogenic claudication: Secondary | ICD-10-CM | POA: Diagnosis not present

## 2021-09-19 DIAGNOSIS — M5416 Radiculopathy, lumbar region: Secondary | ICD-10-CM | POA: Diagnosis not present

## 2021-09-21 DIAGNOSIS — E1165 Type 2 diabetes mellitus with hyperglycemia: Secondary | ICD-10-CM | POA: Diagnosis not present

## 2021-09-21 DIAGNOSIS — I10 Essential (primary) hypertension: Secondary | ICD-10-CM | POA: Diagnosis not present

## 2021-09-21 DIAGNOSIS — Z13220 Encounter for screening for lipoid disorders: Secondary | ICD-10-CM | POA: Diagnosis not present

## 2021-09-21 DIAGNOSIS — K2100 Gastro-esophageal reflux disease with esophagitis, without bleeding: Secondary | ICD-10-CM | POA: Diagnosis not present

## 2021-09-21 DIAGNOSIS — E7849 Other hyperlipidemia: Secondary | ICD-10-CM | POA: Diagnosis not present

## 2021-09-25 DIAGNOSIS — M5416 Radiculopathy, lumbar region: Secondary | ICD-10-CM | POA: Diagnosis not present

## 2021-09-25 DIAGNOSIS — M48061 Spinal stenosis, lumbar region without neurogenic claudication: Secondary | ICD-10-CM | POA: Diagnosis not present

## 2021-09-25 DIAGNOSIS — M4316 Spondylolisthesis, lumbar region: Secondary | ICD-10-CM | POA: Diagnosis not present

## 2021-09-25 DIAGNOSIS — M545 Low back pain, unspecified: Secondary | ICD-10-CM | POA: Diagnosis not present

## 2021-09-26 DIAGNOSIS — M353 Polymyalgia rheumatica: Secondary | ICD-10-CM | POA: Diagnosis not present

## 2021-09-26 DIAGNOSIS — Z6825 Body mass index (BMI) 25.0-25.9, adult: Secondary | ICD-10-CM | POA: Diagnosis not present

## 2021-09-26 DIAGNOSIS — K5190 Ulcerative colitis, unspecified, without complications: Secondary | ICD-10-CM | POA: Diagnosis not present

## 2021-09-26 DIAGNOSIS — M797 Fibromyalgia: Secondary | ICD-10-CM | POA: Diagnosis not present

## 2021-09-26 DIAGNOSIS — E8881 Metabolic syndrome: Secondary | ICD-10-CM | POA: Diagnosis not present

## 2021-09-26 DIAGNOSIS — E039 Hypothyroidism, unspecified: Secondary | ICD-10-CM | POA: Diagnosis not present

## 2021-09-26 DIAGNOSIS — E1165 Type 2 diabetes mellitus with hyperglycemia: Secondary | ICD-10-CM | POA: Diagnosis not present

## 2021-09-26 DIAGNOSIS — K2100 Gastro-esophageal reflux disease with esophagitis, without bleeding: Secondary | ICD-10-CM | POA: Diagnosis not present

## 2021-09-26 DIAGNOSIS — F331 Major depressive disorder, recurrent, moderate: Secondary | ICD-10-CM | POA: Diagnosis not present

## 2021-09-26 DIAGNOSIS — I10 Essential (primary) hypertension: Secondary | ICD-10-CM | POA: Diagnosis not present

## 2021-10-04 DIAGNOSIS — E039 Hypothyroidism, unspecified: Secondary | ICD-10-CM | POA: Diagnosis not present

## 2021-10-04 DIAGNOSIS — I10 Essential (primary) hypertension: Secondary | ICD-10-CM | POA: Diagnosis not present

## 2021-10-04 DIAGNOSIS — K219 Gastro-esophageal reflux disease without esophagitis: Secondary | ICD-10-CM | POA: Diagnosis not present

## 2021-10-16 ENCOUNTER — Ambulatory Visit: Payer: PPO | Attending: Cardiology | Admitting: Cardiology

## 2021-10-16 ENCOUNTER — Encounter: Payer: Self-pay | Admitting: Cardiology

## 2021-10-16 VITALS — BP 130/72 | HR 52 | Ht 65.0 in | Wt 148.6 lb

## 2021-10-16 DIAGNOSIS — R002 Palpitations: Secondary | ICD-10-CM | POA: Diagnosis not present

## 2021-10-16 DIAGNOSIS — I10 Essential (primary) hypertension: Secondary | ICD-10-CM | POA: Diagnosis not present

## 2021-10-16 DIAGNOSIS — I951 Orthostatic hypotension: Secondary | ICD-10-CM

## 2021-10-16 MED ORDER — MIDODRINE HCL 2.5 MG PO TABS: 2.5000 mg | ORAL_TABLET | Freq: Two times a day (BID) | ORAL | 6 refills | Status: DC

## 2021-10-16 NOTE — Patient Instructions (Signed)
Medication Instructions:  Begin Midodrine 2.51m twice a day   Continue all other medications.    Labwork: none  Testing/Procedures: none  Follow-Up: 6 months   Any Other Special Instructions Will Be Listed Below (If Applicable).  If you need a refill on your cardiac medications before your next appointment, please call your pharmacy.

## 2021-10-16 NOTE — Progress Notes (Signed)
Clinical Summary Ms. Hendley is a 76 y.o.female seen today for follow up of the following medical problems.    1. Palpitations - seen in ER 03/28/16 with palpitations - symptoms started 8 weeks ago. Feeling of heart pounding, can feel dizzy. Occurs at rest or with exertion. Can get get diaphoretic. No SOB no chest pain. Lasts just a few seconds. Occurs several times a day. Can awake from sleep. No specific trigger - coffee x 2cups, no tea, no sodas, no energy drinks, no EtOH. Drinks 1 bottle of water. - she reports atenolol was decreased to 25 mg daily, orthostatic in clinic. During recent ER visit increased back to 53m daily. Symptopms have not imrpoved - TSH 1.167, K 3.3, Cr 0.63   - 04/2016 normal heart monitor  - 10/2018 heart monitor rare supraventriuclar and ventricular ectopy. Rare short runs atach. Min HR 54, Max HR 109, Avg HR 68   Jan 2022 monitor: Rare supraventicular ectopy, occasional ventriculare ectopy. Short runs of SVT up to 15 beats.   - denies any palpitations - taking atenolol 269mbid.      2. CAD? - CAD is mentioned in her PMH. She unclear of this diagnosis - [facility] she reports she was told to be normal - no symptoms     3. Orthostatic hypotension - 10/2018 heart monitor rare supraventriuclar and ventricular ectopy. Rare short runs atach. Min HR 54, Max HR 109, Avg HR 68   - prior clinic visit 38 point drop in SBP with standing.  - at prior visit she reported she was no longer taking the midodrine.  - some ongoing orthostatic dizziness  - some ongoing dizziness, typically with standing.    4 polyneuraligia,        5. HTN - she is compliant with meds   SH: her husband is [employer] trad[employer]so a patient of mine    Past Medical History:  Diagnosis Date   Anxiety    Asthma    asthmatic bronchitis   Coronary artery disease    Dementia ([facility]   3rd stage dementia   Depression    Diabetes  mellitus without complication ([facility]   Dysrhythmia    Fibromyalgia    GERD (gastroesophageal reflux disease)    Glaucoma    Hypertension    Hypothyroidism    IBS (irritable bowel syndrome)    Polymyalgia rheumatica (HCC)    Rheumatic fever    Stroke ([facility]  1993   left sided weakness; memorey loss   Thyroid disease    Ulcerative colitis ([facility]   per patient diagnosed at age 76   Allergies  Allergen Reactions   Contrast Media [Iodinated Contrast Media] Nausea And Vomiting and Other (See Comments)    Increased heart rate and sweating "a long time ago."  01/25/14 pt states she "went out" after receiving iv contrast 25 yrs ago and [facility]   Aspirin Other (See Comments)    Bleeding precautions secondary to ulcerative colitis.   Codeine Nausea And Vomiting   Morphine And Related Nausea And Vomiting   Sulfa Antibiotics Nausea And Vomiting   Benadryl [Diphenhydramine] Other (See Comments)    Makes me feel like I'm having a seizure.     Current Outpatient Medications  Medication Sig Dispense Refill   alendronate (FOSAMAX) 70 MG tablet Take 70 mg by mouth  every Wednesday. Take with a full glass of water on an empty stomach.     atenolol (TENORMIN) 25 MG tablet Take 1.5 tablets (37.5 mg total) by mouth every morning AND 0.5 tablets (12.5 mg total) every evening. 180 tablet 2   Azelastine HCl 137 MCG/SPRAY SOLN Place 1 spray into both nostrils 2 (two) times daily.     Camphor-Menthol-Methyl Sal (SALONPAS) 3.02-10-08 % PTCH Apply 1 packet topically daily as needed (Back pain).     cycloSPORINE (RESTASIS) 0.05 % ophthalmic emulsion Place 1 drop into both eyes daily.     diphenhydramine-acetaminophen (TYLENOL PM) 25-500 MG TABS tablet Take 1 tablet by mouth at bedtime.     estradiol (ESTRACE) 0.5 MG tablet Take 0.5 mg by mouth at bedtime.      gabapentin (NEURONTIN) 600 MG tablet Take 600 mg by mouth 2 (two) times daily.       HYDROcodone-acetaminophen (NORCO/VICODIN) 5-325 MG tablet Take 1 tablet by mouth every 6 (six) hours as needed for moderate pain.     latanoprost (XALATAN) 0.005 % ophthalmic solution Place 1 drop into both eyes at bedtime.     levothyroxine (SYNTHROID) 88 MCG tablet Take 88 mcg by mouth daily before breakfast.     omeprazole (PRILOSEC) 20 MG capsule Take 20 mg by mouth daily.     predniSONE (DELTASONE) 5 MG tablet Take 5 mg by mouth daily.      Probiotic Product (PROBIOTIC DAILY PO) Take by mouth daily.     SUMAtriptan (IMITREX) 100 MG tablet Take 100 mg by mouth 2 (two) times daily as needed.     venlafaxine (EFFEXOR) 75 MG tablet Take 75 mg by mouth daily.     No current facility-administered medications for this visit.   Facility-Administered Medications Ordered in Other Visits  Medication Dose Route Frequency Provider Last Rate Last Admin   ondansetron (ZOFRAN) 4 mg in sodium chloride 0.9 % 50 mL IVPB  4 mg Intravenous Q6H PRN Ashok Pall, MD         Past Surgical History:  Procedure Laterality Date   ABDOMINAL HYSTERECTOMY     BIOPSY  06/03/2019   Procedure: BIOPSY;  Surgeon: Daneil Dolin, MD;  [facility];  Service: Endoscopy;;   CATARACT EXTRACTION W/PHACO Right 10/15/2016   Procedure: CATARACT EXTRACTION PHACO AND INTRAOCULAR LENS PLACEMENT ([place]);  Surgeon: Rutherford Guys, MD;  Location: [facility] OR[facility];  Service: Ophthalmology;  Laterality: Right;  CDE: 7.56   CATARACT EXTRACTION W/PHACO Left 01/14/2017   Procedure: CATARACT EXTRACTION PHACO AND INTRAOCULAR LENS PLACEMENT ([facility]);  Surgeon: Rutherford Guys, MD;  Location: [facility];  Service: Ophthalmology;  Laterality: Left;  CDE: 6.28   CHOLECYSTECTOMY     COLONOSCOPY  2013   Dr. Britta Mccreedy: mild left-sided diverticulosis, multiple biopsies with path unremarkable.    COLONOSCOPY WITH PROPOFOL N/A 06/03/2019   pancolonic diverticulosis, normal TI. S/p biopsies. Benign.    ELBOW DEBRIDEMENT Right    THYROID SURGERY     partial  thyroidectomy age 68.     Allergies  Allergen Reactions   Contrast Media [Iodinated Contrast Media] Nausea And Vomiting and Other (See Comments)    Increased heart rate and sweating "a long time ago."  01/25/14 pt states she "went out" after receiving iv contrast 25 yrs ago and was told by [facility] to never have it again   Aspirin Other (See Comments)    Bleeding precautions secondary to ulcerative colitis.   Codeine Nausea And Vomiting   Morphine And Related  Nausea And Vomiting   Sulfa Antibiotics Nausea And Vomiting   Benadryl [Diphenhydramine] Other (See Comments)    Makes me feel like I'm having a seizure.      Family History  Problem Relation Age of Onset   Cancer Mother    Stroke Father    Addison's disease Sister    Alzheimer's disease Brother    Colon cancer Niece        deceased at 80      Social History Ms. Walen reports that she quit smoking about 31 years ago. Her smoking use included cigarettes. She has never used smokeless tobacco. Ms. Wanner reports no history of alcohol use.   Review of Systems CONSTITUTIONAL: No weight loss, fever, chills, weakness or fatigue.  HEENT: Eyes: No visual loss, blurred vision, double vision or yellow sclerae.No hearing loss, sneezing, congestion, runny nose or sore throat.  SKIN: No rash or itching.  CARDIOVASCULAR: pe rhpi RESPIRATORY: No shortness of breath, cough or sputum.  GASTROINTESTINAL: No anorexia, nausea, vomiting or diarrhea. No abdominal pain or blood.  GENITOURINARY: No burning on urination, no polyuria NEUROLOGICAL: +dizziness  MUSCULOSKELETAL: No muscle, back pain, joint pain or stiffness.  LYMPHATICS: No enlarged nodes. No history of splenectomy.  PSYCHIATRIC: No history of depression or anxiety.  ENDOCRINOLOGIC: No reports of sweating, cold or heat intolerance. No polyuria or polydipsia.  Marland Kitchen   Physical Examination Today's Vitals   10/16/21 1249  BP: 130/72  Pulse: (!) 52  SpO2: 96%  Weight:  148 lb 9.6 oz (67.4 kg)  Height: 5' 5"  (1.651 m)   Body mass index is 24.73 kg/m.   Gen: resting comfortably, no acute distress HEENT: no scleral icterus, pupils equal round and reactive, no palptable cervical adenopathy,  CV: RRR, no mr/g no jvs Resp: Clear to auscultation bilaterally GI: abdomen is soft, non-tender, non-distended, normal bowel sounds, no hepatosplenomegaly MSK: extremities are warm, no edema.  Skin: warm, no rash Neuro:  no focal deficits Psych: appropriate affect   Diagnostic Studies  04/2016 heart monitor Telemetry tracings show normal sinus rhythm No symptoms reported No significant arrhythmias   Jan 2022 monitor Predominant rhythm is sinus rhythm Rare supraventicular ectopy, occasional ventriculare ectopy. Short runs of SVT up to 15 beats. No symptoms reported     Patch Wear Time:  12 days and 6 hours (2022-01-07T10:56:47-0500 to 2022-01-19T17:44:18-0500)   Patient had a min HR of 47 bpm, max HR of 176 bpm, and avg HR of 68 bpm. Predominant underlying rhythm was Sinus Rhythm. 50 Supraventricular Tachycardia runs occurred, the run with the fastest interval lasting 13 beats with a max rate of 176 bpm, the  longest lasting 15 beats with an avg rate of 115 bpm. Isolated SVEs were rare (<1.0%), SVE Couplets were rare (<1.0%), and SVE Triplets were rare (<1.0%). Isolated VEs were occasional (1.9%, 21004), VE Couplets were rare (<1.0%, 65), and VE Triplets were  rare (<1.0%, 1). Ventricular Bigeminy and Trigeminy were present.    Assessment and Plan    1. Palpitations -doing well on atenolol 19m bid, continue current meds   2.Orthostatic hypotension - she stopped her midodrine prevouisly - some ongoing symptoms, restart midodrine 2.560mbid.    3. HTN -she is at goal, accept higher target in general for her given orthostatic hypotension.       JoArnoldo LenisM.D.

## 2021-10-17 DIAGNOSIS — Z6825 Body mass index (BMI) 25.0-25.9, adult: Secondary | ICD-10-CM | POA: Diagnosis not present

## 2021-10-17 DIAGNOSIS — M5136 Other intervertebral disc degeneration, lumbar region: Secondary | ICD-10-CM | POA: Diagnosis not present

## 2021-10-17 DIAGNOSIS — M797 Fibromyalgia: Secondary | ICD-10-CM | POA: Diagnosis not present

## 2021-10-17 DIAGNOSIS — E663 Overweight: Secondary | ICD-10-CM | POA: Diagnosis not present

## 2021-10-17 DIAGNOSIS — Z7952 Long term (current) use of systemic steroids: Secondary | ICD-10-CM | POA: Diagnosis not present

## 2021-10-17 DIAGNOSIS — M8589 Other specified disorders of bone density and structure, multiple sites: Secondary | ICD-10-CM | POA: Diagnosis not present

## 2021-10-17 DIAGNOSIS — M353 Polymyalgia rheumatica: Secondary | ICD-10-CM | POA: Diagnosis not present

## 2021-10-24 ENCOUNTER — Other Ambulatory Visit: Payer: Self-pay | Admitting: Internal Medicine

## 2021-10-24 DIAGNOSIS — M8580 Other specified disorders of bone density and structure, unspecified site: Secondary | ICD-10-CM

## 2021-12-16 ENCOUNTER — Other Ambulatory Visit: Payer: Self-pay | Admitting: Cardiology

## 2022-02-03 DIAGNOSIS — K219 Gastro-esophageal reflux disease without esophagitis: Secondary | ICD-10-CM | POA: Diagnosis not present

## 2022-02-03 DIAGNOSIS — I10 Essential (primary) hypertension: Secondary | ICD-10-CM | POA: Diagnosis not present

## 2022-02-03 DIAGNOSIS — E039 Hypothyroidism, unspecified: Secondary | ICD-10-CM | POA: Diagnosis not present

## 2022-02-07 DIAGNOSIS — E7849 Other hyperlipidemia: Secondary | ICD-10-CM | POA: Diagnosis not present

## 2022-02-07 DIAGNOSIS — E038 Other specified hypothyroidism: Secondary | ICD-10-CM | POA: Diagnosis not present

## 2022-02-07 DIAGNOSIS — K219 Gastro-esophageal reflux disease without esophagitis: Secondary | ICD-10-CM | POA: Diagnosis not present

## 2022-02-07 DIAGNOSIS — Z8639 Personal history of other endocrine, nutritional and metabolic disease: Secondary | ICD-10-CM | POA: Diagnosis not present

## 2022-02-07 DIAGNOSIS — E039 Hypothyroidism, unspecified: Secondary | ICD-10-CM | POA: Diagnosis not present

## 2022-02-07 DIAGNOSIS — E8881 Metabolic syndrome: Secondary | ICD-10-CM | POA: Diagnosis not present

## 2022-02-07 DIAGNOSIS — E782 Mixed hyperlipidemia: Secondary | ICD-10-CM | POA: Diagnosis not present

## 2022-02-07 DIAGNOSIS — I10 Essential (primary) hypertension: Secondary | ICD-10-CM | POA: Diagnosis not present

## 2022-02-07 DIAGNOSIS — E1169 Type 2 diabetes mellitus with other specified complication: Secondary | ICD-10-CM | POA: Diagnosis not present

## 2022-02-11 DIAGNOSIS — M5136 Other intervertebral disc degeneration, lumbar region: Secondary | ICD-10-CM | POA: Diagnosis not present

## 2022-02-11 DIAGNOSIS — Z6824 Body mass index (BMI) 24.0-24.9, adult: Secondary | ICD-10-CM | POA: Diagnosis not present

## 2022-02-11 DIAGNOSIS — Z7952 Long term (current) use of systemic steroids: Secondary | ICD-10-CM | POA: Diagnosis not present

## 2022-02-11 DIAGNOSIS — M353 Polymyalgia rheumatica: Secondary | ICD-10-CM | POA: Diagnosis not present

## 2022-02-11 DIAGNOSIS — M8589 Other specified disorders of bone density and structure, multiple sites: Secondary | ICD-10-CM | POA: Diagnosis not present

## 2022-02-11 DIAGNOSIS — M797 Fibromyalgia: Secondary | ICD-10-CM | POA: Diagnosis not present

## 2022-02-25 DIAGNOSIS — H401131 Primary open-angle glaucoma, bilateral, mild stage: Secondary | ICD-10-CM | POA: Diagnosis not present

## 2022-02-25 DIAGNOSIS — H5203 Hypermetropia, bilateral: Secondary | ICD-10-CM | POA: Diagnosis not present

## 2022-02-25 DIAGNOSIS — H524 Presbyopia: Secondary | ICD-10-CM | POA: Diagnosis not present

## 2022-02-25 DIAGNOSIS — E119 Type 2 diabetes mellitus without complications: Secondary | ICD-10-CM | POA: Diagnosis not present

## 2022-02-25 DIAGNOSIS — Z961 Presence of intraocular lens: Secondary | ICD-10-CM | POA: Diagnosis not present

## 2022-02-27 DIAGNOSIS — M48062 Spinal stenosis, lumbar region with neurogenic claudication: Secondary | ICD-10-CM | POA: Diagnosis not present

## 2022-02-27 DIAGNOSIS — M5416 Radiculopathy, lumbar region: Secondary | ICD-10-CM | POA: Diagnosis not present

## 2022-03-08 ENCOUNTER — Telehealth: Payer: Self-pay | Admitting: Cardiology

## 2022-03-08 NOTE — Telephone Encounter (Signed)
Pt c/o BP issue: STAT if pt c/o blurred vision, one-sided weakness or slurred speech  1. What are your last 5 BP readings? Wed 130/76 HR 56 sitting, standing 180/59 HR 65, Thurs 153/78 sitting , standing 118/74 HR 65, today 130/73 HR 50 sitting, 90/61/64 standing   2. Are you having any other symptoms (ex. Dizziness, headache, blurred vision, passed out)? Dizziness, migraines, has had blurred vision, but not now  3. What is your BP issue? Patient states she has been having fluctuating BP.

## 2022-03-11 NOTE — Telephone Encounter (Signed)
Verify still taking midodrine 2.5mg  bid. Any reason she would be dehydrated, clarify how much she is drinking per day. She has had drops in bp before with standing which is why we started midodrine. If just mild dizziness, if moderate or severe may adjust midodrine. Can she update [place]  Zandra Abts MD

## 2022-03-12 NOTE — Telephone Encounter (Signed)
Left message to return call.  

## 2022-03-12 NOTE — Telephone Encounter (Signed)
Patient is returning LPN's call. Please advise.  

## 2022-03-25 NOTE — Telephone Encounter (Signed)
Left message to return call.  

## 2022-03-26 ENCOUNTER — Telehealth: Payer: Self-pay | Admitting: Cardiology

## 2022-03-26 DIAGNOSIS — M5416 Radiculopathy, lumbar region: Secondary | ICD-10-CM | POA: Diagnosis not present

## 2022-03-26 NOTE — Telephone Encounter (Signed)
Spoke with patient.  She is not sure that she has the Midodrine, but she is not at home currently.  She will check when she gets home & call office back.  Midodrine was prescribed at last OV on 10/16/2021.

## 2022-03-26 NOTE — Telephone Encounter (Signed)
Patient returned call & states that she has looked at home & she does not have this medication.  She does not remember why she never picked it up from pharmacy.

## 2022-03-26 NOTE — Telephone Encounter (Signed)
Addressed in previous phone message.

## 2022-03-26 NOTE — Telephone Encounter (Signed)
Patient states she was returning call. Please advise

## 2022-03-28 NOTE — Telephone Encounter (Signed)
Start the midodrine and update [place] on symptoms please  Tina Abts MD

## 2022-03-28 NOTE — Telephone Encounter (Signed)
Patient notified and verbalized understanding. 

## 2022-04-04 ENCOUNTER — Telehealth: Payer: Self-pay | Admitting: Cardiology

## 2022-04-04 NOTE — Telephone Encounter (Signed)
Pt would like a callback from nurse regarding BP readings as requested. Please advise.

## 2022-04-04 NOTE — Telephone Encounter (Signed)
Patient states that she is still taking the midodrine and she still has the dizziness but it is not as bad. States that she will take midodrine 5 mg in the morning and take 2.5 mg at bedtime (if this is ok with Dr. Harl Bowie) and if her readings are still the same then she will take 5 mg twice a day.

## 2022-04-04 NOTE — Telephone Encounter (Signed)
Patient called with some of her blood pressure readings:  2/27 @ 8:45 am 140/65 P 61 (Sitting), 9:16 am 94/69 P 74 (standing) 2/28 @ 8:45 am 127/65 P 56 (Sitting), 8:50 am 93/70 P 76 (Standing) 2/29 @ 9:40 am 145/72 P 56 (Sitting), 9:43 am 113/76 P 61 (Standing)  States that she still has some of the dizziness but it is not as bad. Please advise

## 2022-04-04 NOTE — Telephone Encounter (Signed)
Is she taking the midodrine? Her bp still drops with standing but we would only adjust the midodrine more so based on symptoms as opposed to the bp dropping. How are her symptoms of dizziness doing? If significant and ongoing can increase midodrine to '5mg'$  bid  Zandra Abts MD

## 2022-04-11 ENCOUNTER — Encounter: Payer: Self-pay | Admitting: Physical Medicine and Rehabilitation

## 2022-04-16 ENCOUNTER — Ambulatory Visit
Admission: RE | Admit: 2022-04-16 | Discharge: 2022-04-16 | Disposition: A | Discharge status: Home / Self Care | Payer: PPO | Source: Ambulatory Visit | Attending: Internal Medicine | Admitting: Internal Medicine

## 2022-04-16 ENCOUNTER — Other Ambulatory Visit: Payer: Self-pay | Admitting: Physical Medicine and Rehabilitation

## 2022-04-16 DIAGNOSIS — M810 Age-related osteoporosis without current pathological fracture: Secondary | ICD-10-CM | POA: Diagnosis not present

## 2022-04-16 DIAGNOSIS — M85832 Other specified disorders of bone density and structure, left forearm: Secondary | ICD-10-CM | POA: Diagnosis not present

## 2022-04-16 DIAGNOSIS — M5416 Radiculopathy, lumbar region: Secondary | ICD-10-CM

## 2022-04-16 DIAGNOSIS — M5010 Cervical disc disorder with radiculopathy, unspecified cervical region: Secondary | ICD-10-CM

## 2022-04-16 DIAGNOSIS — Z780 Asymptomatic menopausal state: Secondary | ICD-10-CM | POA: Diagnosis not present

## 2022-04-16 DIAGNOSIS — M8580 Other specified disorders of bone density and structure, unspecified site: Secondary | ICD-10-CM

## 2022-05-05 DIAGNOSIS — E1165 Type 2 diabetes mellitus with hyperglycemia: Secondary | ICD-10-CM | POA: Diagnosis not present

## 2022-05-05 DIAGNOSIS — I10 Essential (primary) hypertension: Secondary | ICD-10-CM | POA: Diagnosis not present

## 2022-05-06 ENCOUNTER — Encounter: Payer: Self-pay | Admitting: Cardiology

## 2022-05-06 ENCOUNTER — Ambulatory Visit: Payer: PPO | Attending: Cardiology | Admitting: Cardiology

## 2022-05-06 VITALS — BP 124/60 | HR 62 | Ht 64.0 in | Wt 137.8 lb

## 2022-05-06 DIAGNOSIS — R002 Palpitations: Secondary | ICD-10-CM

## 2022-05-06 DIAGNOSIS — I951 Orthostatic hypotension: Secondary | ICD-10-CM

## 2022-05-06 NOTE — Progress Notes (Signed)
Clinical Summary Tina Schultz is a 77 y.o.female seen today for follow up of the following medical problems.    1. Palpitations - 04/2016 normal heart monitor  - 10/2018 heart monitor rare supraventriuclar and ventricular ectopy. Rare short runs atach. Min HR 54, Max HR 109, Avg HR 68   Jan 2022 monitor: Rare supraventicular ectopy, occasional ventriculare ectopy. Short runs of SVT up to 15 beats.   -no recent palpitations.     2. CAD? - CAD is mentioned in her PMH. She unclear of this diagnosis - previous at [facility] admission back in 1980s for heart cath she reports she was told to be normal - no symptoms     3. Orthostatic hypotension - 10/2018 heart monitor rare supraventriuclar and ventricular ectopy. Rare short runs atach. Min HR 54, Max HR 109, Avg HR 68   - prior clinic visit 38 point drop in SBP with standing.  - at prior visit she reported she was no longer taking the midodrine.  - some ongoing orthostatic dizziness   - some ongoing dizziness, typically with standing.   - she restarted midodrine 2.5mg , 1.25mg  in PM.  - working to stay hdyrated - prior labile bp's have leveld off since getting back on midodrine.     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,         SH: her husband is Equities trader, also a patient of mine Past Medical History:  Diagnosis Date   Anxiety    Asthma    asthmatic bronchitis   Coronary artery disease    Dementia ([place])    3rd stage dementia   Depression    Diabetes mellitus without complication ([place])    Dysrhythmia    Fibromyalgia    GERD (gastroesophageal reflux disease)    Glaucoma    Hypertension    Hypothyroidism    IBS (irritable bowel syndrome)    Polymyalgia rheumatica (HCC)    Rheumatic fever    Stroke ([place]) 1991  1993   left sided weakness; memorey loss   Thyroid disease    Ulcerative colitis ([place])    per patient diagnosed at age 89     Allergies  Allergen Reactions   Contrast Media [Iodinated Contrast Media] Nausea And  Vomiting and Other (See Comments)    Increased heart rate and sweating "a long time ago."  01/25/14 pt states she "went out" after receiving iv contrast 25 yrs ago and was told by [facility] to never have it again   Aspirin Other (See Comments)    Bleeding precautions secondary to ulcerative colitis.   Codeine Nausea And Vomiting   Morphine And Related Nausea And Vomiting   Sulfa Antibiotics Nausea And Vomiting   Benadryl [Diphenhydramine] Other (See Comments)    Makes me feel like I'm having a seizure.     Current Outpatient Medications  Medication Sig Dispense Refill   alendronate (FOSAMAX) 70 MG tablet Take 70 mg by mouth every Wednesday. Take with a full glass of water on an empty stomach.     atenolol (TENORMIN) 25 MG tablet Take 1 tablet (25 mg total) by mouth 2 (two) times daily. 180 tablet 2   Azelastine HCl 137 MCG/SPRAY SOLN Place 1 spray into both nostrils 2 (two) times daily. (Patient not taking: Reported on 10/16/2021)     Camphor-Menthol-Methyl Sal (SALONPAS) 3.02-10-08 % PTCH Apply 1 packet topically daily as needed (Back pain).     cycloSPORINE (RESTASIS) 0.05 % ophthalmic emulsion Place 1 drop into both  eyes daily.     diphenhydramine-acetaminophen (TYLENOL PM) 25-500 MG TABS tablet Take 1 tablet by mouth at bedtime.     estradiol (ESTRACE) 0.5 MG tablet Take 0.5 mg by mouth at bedtime.      gabapentin (NEURONTIN) 600 MG tablet Take 600 mg by mouth 2 (two) times daily.      HYDROcodone-acetaminophen (NORCO/VICODIN) 5-325 MG tablet Take 1 tablet by mouth every 6 (six) hours as needed for moderate pain.     latanoprost (XALATAN) 0.005 % ophthalmic solution Place 1 drop into both eyes at bedtime.     levothyroxine (SYNTHROID) 88 MCG tablet Take 88 mcg by mouth daily before breakfast.     midodrine (PROAMATINE) 2.5 MG tablet Take 1 tablet (2.5 mg total) by mouth 2 (two) times daily with a meal. 60 tablet 6   omeprazole (PRILOSEC) 20 MG capsule Take 20 mg by mouth daily.      predniSONE (DELTASONE) 5 MG tablet Take 6 mg by mouth daily.     Probiotic Product (PROBIOTIC DAILY PO) Take by mouth daily.     SUMAtriptan (IMITREX) 100 MG tablet Take 100 mg by mouth 2 (two) times daily as needed.     venlafaxine (EFFEXOR) 75 MG tablet Take 75 mg by mouth daily.     No current facility-administered medications for this visit.   Facility-Administered Medications Ordered in Other Visits  Medication Dose Route Frequency Provider Last Rate Last Admin   ondansetron (ZOFRAN) 4 mg in sodium chloride 0.9 % 50 mL IVPB  4 mg Intravenous Q6H PRN Ashok Pall, MD         Past Surgical History:  Procedure Laterality Date   ABDOMINAL HYSTERECTOMY     BIOPSY  06/03/2019   Procedure: BIOPSY;  Surgeon: Daneil Dolin, MD;  [facility];  Service: Endoscopy;;   CATARACT EXTRACTION W/PHACO Right 10/15/2016   Procedure: CATARACT EXTRACTION PHACO AND INTRAOCULAR LENS PLACEMENT ([place]);  Surgeon: Rutherford Guys, MD;  Location: [facility] ORS;  Service: Ophthalmology;  Laterality: Right;  CDE: 7.56   CATARACT EXTRACTION W/PHACO Left 01/14/2017   Procedure: CATARACT EXTRACTION PHACO AND INTRAOCULAR LENS PLACEMENT ([facility]);  Surgeon: Rutherford Guys, MD;  Location: [facility] ORS;  Service: Ophthalmology;  Laterality: Left;  CDE: 6.28   CHOLECYSTECTOMY     COLONOSCOPY  2013   Dr. Britta Mccreedy: mild left-sided diverticulosis, multiple biopsies with path unremarkable.    COLONOSCOPY WITH PROPOFOL N/A 06/03/2019   pancolonic diverticulosis, normal TI. S/p biopsies. Benign.    ELBOW DEBRIDEMENT Right    THYROID SURGERY     partial thyroidectomy age 36.     Allergies  Allergen Reactions   Contrast Media [Iodinated Contrast Media] Nausea And Vomiting and Other (See Comments)    Increased heart rate and sweating "a long time ago."  01/25/14 pt states she "went out" after receiving iv contrast 25 yrs ago and was told by [facility] to never have it again   Aspirin Other (See Comments)    Bleeding  precautions secondary to ulcerative colitis.   Codeine Nausea And Vomiting   Morphine And Related Nausea And Vomiting   Sulfa Antibiotics Nausea And Vomiting   Benadryl [Diphenhydramine] Other (See Comments)    Makes me feel like I'm having a seizure.      Family History  Problem Relation Age of Onset   Cancer Mother    Stroke Father    Addison's disease Sister    Alzheimer's disease Brother    Colon cancer Niece  deceased at 34      Social History Ms. Dimartino reports that she quit smoking about 32 years ago. Her smoking use included cigarettes. She has never been exposed to tobacco smoke. She has never used smokeless tobacco. Ms. Marvin reports no history of alcohol use.   Review of Systems CONSTITUTIONAL: No weight loss, fever, chills, weakness or fatigue.  HEENT: Eyes: No visual loss, blurred vision, double vision or yellow sclerae.No hearing loss, sneezing, congestion, runny nose or sore throat.  SKIN: No rash or itching.  CARDIOVASCULAR: per hpi RESPIRATORY: No shortness of breath, cough or sputum.  GASTROINTESTINAL: No anorexia, nausea, vomiting or diarrhea. No abdominal pain or blood.  GENITOURINARY: No burning on urination, no polyuria NEUROLOGICAL: No headache, dizziness, syncope, paralysis, ataxia, numbness or tingling in the extremities. No change in bowel or bladder control.  MUSCULOSKELETAL: No muscle, back pain, joint pain or stiffness.  LYMPHATICS: No enlarged nodes. No history of splenectomy.  PSYCHIATRIC: No history of depression or anxiety.  ENDOCRINOLOGIC: No reports of sweating, cold or heat intolerance. No polyuria or polydipsia.  Marland Kitchen   Physical Examination Today's Vitals   05/06/22 1329  BP: 124/60  Pulse: 62  SpO2: 98%  Weight: 137 lb 12.8 oz (62.5 kg)  Height: 5\' 4"  (1.626 m)   Body mass index is 23.65 kg/m.  Gen: resting comfortably, no acute distress HEENT: no scleral icterus, pupils equal round and reactive, no palptable cervical  adenopathy,  CV: RRR, no m/rg, no jvd Resp: Clear to auscultation bilaterally GI: abdomen is soft, non-tender, non-distended, normal bowel sounds, no hepatosplenomegaly MSK: extremities are warm, no edema.  Skin: warm, no rash Neuro:  no focal deficits Psych: appropriate affect   Diagnostic Studies  04/2016 heart monitor Telemetry tracings show normal sinus rhythm No symptoms reported No significant arrhythmias   Jan 2022 monitor Predominant rhythm is sinus rhythm Rare supraventicular ectopy, occasional ventriculare ectopy. Short runs of SVT up to 15 beats. No symptoms reported     Patch Wear Time:  12 days and 6 hours (2022-01-07T10:56:47-0500 to 2022-01-19T17:44:18-0500)   Patient had a min HR of 47 bpm, max HR of 176 bpm, and avg HR of 68 bpm. Predominant underlying rhythm was Sinus Rhythm. 50 Supraventricular Tachycardia runs occurred, the run with the fastest interval lasting 13 beats with a max rate of 176 bpm, the  longest lasting 15 beats with an avg rate of 115 bpm. Isolated SVEs were rare (<1.0%), SVE Couplets were rare (<1.0%), and SVE Triplets were rare (<1.0%). Isolated VEs were occasional (1.9%, 21004), VE Couplets were rare (<1.0%, 65), and VE Triplets were  rare (<1.0%, 1). Ventricular Bigeminy and Trigeminy were present.      Assessment and Plan  1. Palpitations -doing well without symptoms, continue current meds - EKG today shows NSR   2.Orthostatic hypotension - recent labile bp's which have improved since restarting midodrine. She reports did not feel well on 2.5mg  bid, taking 2.5mg  in AM and 1.25mg  in pm and feeling well - continue current meds      Arnoldo Lenis, M.D.,

## 2022-05-06 NOTE — Patient Instructions (Signed)
Medication Instructions:  Continue all current medications.   Labwork: none  Testing/Procedures: none  Follow-Up: 6 months   Any Other Special Instructions Will Be Listed Below (If Applicable).   If you need a refill on your cardiac medications before your next appointment, please call your pharmacy.  

## 2022-05-07 ENCOUNTER — Encounter: Payer: Self-pay | Admitting: *Deleted

## 2022-05-10 DIAGNOSIS — Z6823 Body mass index (BMI) 23.0-23.9, adult: Secondary | ICD-10-CM | POA: Diagnosis not present

## 2022-05-10 DIAGNOSIS — K5190 Ulcerative colitis, unspecified, without complications: Secondary | ICD-10-CM | POA: Diagnosis not present

## 2022-05-10 DIAGNOSIS — F331 Major depressive disorder, recurrent, moderate: Secondary | ICD-10-CM | POA: Diagnosis not present

## 2022-05-10 DIAGNOSIS — M353 Polymyalgia rheumatica: Secondary | ICD-10-CM | POA: Diagnosis not present

## 2022-05-10 DIAGNOSIS — I10 Essential (primary) hypertension: Secondary | ICD-10-CM | POA: Diagnosis not present

## 2022-05-10 DIAGNOSIS — Z8639 Personal history of other endocrine, nutritional and metabolic disease: Secondary | ICD-10-CM | POA: Diagnosis not present

## 2022-05-10 DIAGNOSIS — Z0001 Encounter for general adult medical examination with abnormal findings: Secondary | ICD-10-CM | POA: Diagnosis not present

## 2022-05-10 DIAGNOSIS — M797 Fibromyalgia: Secondary | ICD-10-CM | POA: Diagnosis not present

## 2022-05-10 DIAGNOSIS — E1165 Type 2 diabetes mellitus with hyperglycemia: Secondary | ICD-10-CM | POA: Diagnosis not present

## 2022-05-10 DIAGNOSIS — E039 Hypothyroidism, unspecified: Secondary | ICD-10-CM | POA: Diagnosis not present

## 2022-05-10 DIAGNOSIS — K2100 Gastro-esophageal reflux disease with esophagitis, without bleeding: Secondary | ICD-10-CM | POA: Diagnosis not present

## 2022-05-13 DIAGNOSIS — Z0001 Encounter for general adult medical examination with abnormal findings: Secondary | ICD-10-CM | POA: Diagnosis not present

## 2022-05-13 DIAGNOSIS — E7849 Other hyperlipidemia: Secondary | ICD-10-CM | POA: Diagnosis not present

## 2022-05-13 DIAGNOSIS — E1165 Type 2 diabetes mellitus with hyperglycemia: Secondary | ICD-10-CM | POA: Diagnosis not present

## 2022-05-13 DIAGNOSIS — F17210 Nicotine dependence, cigarettes, uncomplicated: Secondary | ICD-10-CM | POA: Diagnosis not present

## 2022-05-13 DIAGNOSIS — M797 Fibromyalgia: Secondary | ICD-10-CM | POA: Diagnosis not present

## 2022-05-13 DIAGNOSIS — E039 Hypothyroidism, unspecified: Secondary | ICD-10-CM | POA: Diagnosis not present

## 2022-05-13 DIAGNOSIS — I10 Essential (primary) hypertension: Secondary | ICD-10-CM | POA: Diagnosis not present

## 2022-05-14 ENCOUNTER — Other Ambulatory Visit: Payer: PPO

## 2022-05-14 ENCOUNTER — Inpatient Hospital Stay: Admission: RE | Admit: 2022-05-14 | Payer: PPO | Source: Ambulatory Visit

## 2022-05-22 ENCOUNTER — Encounter: Payer: Self-pay | Admitting: Physical Medicine and Rehabilitation

## 2022-07-14 ENCOUNTER — Other Ambulatory Visit: Payer: Self-pay | Admitting: Cardiology

## 2022-07-16 DIAGNOSIS — M8589 Other specified disorders of bone density and structure, multiple sites: Secondary | ICD-10-CM | POA: Diagnosis not present

## 2022-07-16 DIAGNOSIS — M797 Fibromyalgia: Secondary | ICD-10-CM | POA: Diagnosis not present

## 2022-07-16 DIAGNOSIS — M5136 Other intervertebral disc degeneration, lumbar region: Secondary | ICD-10-CM | POA: Diagnosis not present

## 2022-07-16 DIAGNOSIS — Z6822 Body mass index (BMI) 22.0-22.9, adult: Secondary | ICD-10-CM | POA: Diagnosis not present

## 2022-07-16 DIAGNOSIS — M353 Polymyalgia rheumatica: Secondary | ICD-10-CM | POA: Diagnosis not present

## 2022-07-16 DIAGNOSIS — Z7952 Long term (current) use of systemic steroids: Secondary | ICD-10-CM | POA: Diagnosis not present

## 2022-07-16 DIAGNOSIS — M810 Age-related osteoporosis without current pathological fracture: Secondary | ICD-10-CM | POA: Diagnosis not present

## 2022-07-23 ENCOUNTER — Other Ambulatory Visit (HOSPITAL_COMMUNITY): Payer: PPO

## 2022-07-23 ENCOUNTER — Encounter (HOSPITAL_COMMUNITY): Payer: Self-pay

## 2022-07-25 DIAGNOSIS — H401131 Primary open-angle glaucoma, bilateral, mild stage: Secondary | ICD-10-CM | POA: Diagnosis not present

## 2022-07-25 DIAGNOSIS — Z961 Presence of intraocular lens: Secondary | ICD-10-CM | POA: Diagnosis not present

## 2022-07-25 DIAGNOSIS — H16223 Keratoconjunctivitis sicca, not specified as Sjogren's, bilateral: Secondary | ICD-10-CM | POA: Diagnosis not present

## 2022-07-25 DIAGNOSIS — E119 Type 2 diabetes mellitus without complications: Secondary | ICD-10-CM | POA: Diagnosis not present

## 2022-07-30 DIAGNOSIS — R030 Elevated blood-pressure reading, without diagnosis of hypertension: Secondary | ICD-10-CM | POA: Diagnosis not present

## 2022-07-30 DIAGNOSIS — Z6821 Body mass index (BMI) 21.0-21.9, adult: Secondary | ICD-10-CM | POA: Diagnosis not present

## 2022-07-30 DIAGNOSIS — M5416 Radiculopathy, lumbar region: Secondary | ICD-10-CM | POA: Diagnosis not present

## 2022-08-01 DIAGNOSIS — M810 Age-related osteoporosis without current pathological fracture: Secondary | ICD-10-CM | POA: Diagnosis not present

## 2022-08-05 DIAGNOSIS — M4186 Other forms of scoliosis, lumbar region: Secondary | ICD-10-CM | POA: Diagnosis not present

## 2022-08-05 DIAGNOSIS — M79604 Pain in right leg: Secondary | ICD-10-CM | POA: Diagnosis not present

## 2022-08-05 DIAGNOSIS — M5136 Other intervertebral disc degeneration, lumbar region: Secondary | ICD-10-CM | POA: Diagnosis not present

## 2022-08-05 DIAGNOSIS — M4807 Spinal stenosis, lumbosacral region: Secondary | ICD-10-CM | POA: Diagnosis not present

## 2022-08-05 DIAGNOSIS — M545 Low back pain, unspecified: Secondary | ICD-10-CM | POA: Diagnosis not present

## 2022-08-05 DIAGNOSIS — M79605 Pain in left leg: Secondary | ICD-10-CM | POA: Diagnosis not present

## 2022-08-05 DIAGNOSIS — M48061 Spinal stenosis, lumbar region without neurogenic claudication: Secondary | ICD-10-CM | POA: Diagnosis not present

## 2022-08-14 DIAGNOSIS — M5451 Vertebrogenic low back pain: Secondary | ICD-10-CM | POA: Diagnosis not present

## 2022-08-30 ENCOUNTER — Other Ambulatory Visit: Payer: Self-pay | Admitting: Cardiology

## 2022-09-23 DIAGNOSIS — R030 Elevated blood-pressure reading, without diagnosis of hypertension: Secondary | ICD-10-CM | POA: Diagnosis not present

## 2022-09-23 DIAGNOSIS — U071 COVID-19: Secondary | ICD-10-CM | POA: Diagnosis not present

## 2022-09-23 DIAGNOSIS — Z6822 Body mass index (BMI) 22.0-22.9, adult: Secondary | ICD-10-CM | POA: Diagnosis not present

## 2022-10-16 DIAGNOSIS — H6990 Unspecified Eustachian tube disorder, unspecified ear: Secondary | ICD-10-CM | POA: Diagnosis not present

## 2022-10-16 DIAGNOSIS — Z6822 Body mass index (BMI) 22.0-22.9, adult: Secondary | ICD-10-CM | POA: Diagnosis not present

## 2022-10-16 DIAGNOSIS — R030 Elevated blood-pressure reading, without diagnosis of hypertension: Secondary | ICD-10-CM | POA: Diagnosis not present

## 2022-10-16 DIAGNOSIS — J309 Allergic rhinitis, unspecified: Secondary | ICD-10-CM | POA: Diagnosis not present

## 2022-10-18 DIAGNOSIS — M545 Low back pain, unspecified: Secondary | ICD-10-CM | POA: Diagnosis not present

## 2022-10-22 DIAGNOSIS — H6690 Otitis media, unspecified, unspecified ear: Secondary | ICD-10-CM | POA: Diagnosis not present

## 2022-10-22 DIAGNOSIS — M545 Low back pain, unspecified: Secondary | ICD-10-CM | POA: Diagnosis not present

## 2022-10-22 DIAGNOSIS — R030 Elevated blood-pressure reading, without diagnosis of hypertension: Secondary | ICD-10-CM | POA: Diagnosis not present

## 2022-10-22 DIAGNOSIS — Z6822 Body mass index (BMI) 22.0-22.9, adult: Secondary | ICD-10-CM | POA: Diagnosis not present

## 2022-10-25 DIAGNOSIS — M545 Low back pain, unspecified: Secondary | ICD-10-CM | POA: Diagnosis not present

## 2022-11-04 DIAGNOSIS — I10 Essential (primary) hypertension: Secondary | ICD-10-CM | POA: Diagnosis not present

## 2022-11-04 DIAGNOSIS — G43909 Migraine, unspecified, not intractable, without status migrainosus: Secondary | ICD-10-CM | POA: Diagnosis not present

## 2022-11-19 DIAGNOSIS — E7849 Other hyperlipidemia: Secondary | ICD-10-CM | POA: Diagnosis not present

## 2022-11-19 DIAGNOSIS — E039 Hypothyroidism, unspecified: Secondary | ICD-10-CM | POA: Diagnosis not present

## 2022-11-19 DIAGNOSIS — E1165 Type 2 diabetes mellitus with hyperglycemia: Secondary | ICD-10-CM | POA: Diagnosis not present

## 2022-11-22 DIAGNOSIS — M797 Fibromyalgia: Secondary | ICD-10-CM | POA: Diagnosis not present

## 2022-11-22 DIAGNOSIS — Z8639 Personal history of other endocrine, nutritional and metabolic disease: Secondary | ICD-10-CM | POA: Diagnosis not present

## 2022-11-22 DIAGNOSIS — F331 Major depressive disorder, recurrent, moderate: Secondary | ICD-10-CM | POA: Diagnosis not present

## 2022-11-22 DIAGNOSIS — Z6822 Body mass index (BMI) 22.0-22.9, adult: Secondary | ICD-10-CM | POA: Diagnosis not present

## 2022-11-22 DIAGNOSIS — I10 Essential (primary) hypertension: Secondary | ICD-10-CM | POA: Diagnosis not present

## 2022-11-22 DIAGNOSIS — E039 Hypothyroidism, unspecified: Secondary | ICD-10-CM | POA: Diagnosis not present

## 2022-11-22 DIAGNOSIS — Z23 Encounter for immunization: Secondary | ICD-10-CM | POA: Diagnosis not present

## 2022-11-22 DIAGNOSIS — M353 Polymyalgia rheumatica: Secondary | ICD-10-CM | POA: Diagnosis not present

## 2022-11-22 DIAGNOSIS — K5190 Ulcerative colitis, unspecified, without complications: Secondary | ICD-10-CM | POA: Diagnosis not present

## 2022-11-22 DIAGNOSIS — E1165 Type 2 diabetes mellitus with hyperglycemia: Secondary | ICD-10-CM | POA: Diagnosis not present

## 2022-11-22 DIAGNOSIS — K2100 Gastro-esophageal reflux disease with esophagitis, without bleeding: Secondary | ICD-10-CM | POA: Diagnosis not present

## 2022-12-09 DIAGNOSIS — R030 Elevated blood-pressure reading, without diagnosis of hypertension: Secondary | ICD-10-CM | POA: Diagnosis not present

## 2022-12-09 DIAGNOSIS — Z20828 Contact with and (suspected) exposure to other viral communicable diseases: Secondary | ICD-10-CM | POA: Diagnosis not present

## 2022-12-09 DIAGNOSIS — Z6821 Body mass index (BMI) 21.0-21.9, adult: Secondary | ICD-10-CM | POA: Diagnosis not present

## 2022-12-09 DIAGNOSIS — J069 Acute upper respiratory infection, unspecified: Secondary | ICD-10-CM | POA: Diagnosis not present

## 2023-01-01 DIAGNOSIS — M5416 Radiculopathy, lumbar region: Secondary | ICD-10-CM | POA: Diagnosis not present

## 2023-01-01 DIAGNOSIS — M549 Dorsalgia, unspecified: Secondary | ICD-10-CM | POA: Diagnosis not present

## 2023-01-13 DIAGNOSIS — J069 Acute upper respiratory infection, unspecified: Secondary | ICD-10-CM | POA: Diagnosis not present

## 2023-01-13 DIAGNOSIS — Z20828 Contact with and (suspected) exposure to other viral communicable diseases: Secondary | ICD-10-CM | POA: Diagnosis not present

## 2023-01-13 DIAGNOSIS — Z6821 Body mass index (BMI) 21.0-21.9, adult: Secondary | ICD-10-CM | POA: Diagnosis not present

## 2023-01-13 DIAGNOSIS — J40 Bronchitis, not specified as acute or chronic: Secondary | ICD-10-CM | POA: Diagnosis not present

## 2023-01-13 DIAGNOSIS — R030 Elevated blood-pressure reading, without diagnosis of hypertension: Secondary | ICD-10-CM | POA: Diagnosis not present

## 2023-01-21 DIAGNOSIS — M5416 Radiculopathy, lumbar region: Secondary | ICD-10-CM | POA: Diagnosis not present

## 2023-02-03 DIAGNOSIS — M810 Age-related osteoporosis without current pathological fracture: Secondary | ICD-10-CM | POA: Diagnosis not present

## 2023-02-04 DIAGNOSIS — I10 Essential (primary) hypertension: Secondary | ICD-10-CM | POA: Diagnosis not present

## 2023-02-04 DIAGNOSIS — E1165 Type 2 diabetes mellitus with hyperglycemia: Secondary | ICD-10-CM | POA: Diagnosis not present

## 2023-02-04 DIAGNOSIS — E039 Hypothyroidism, unspecified: Secondary | ICD-10-CM | POA: Diagnosis not present

## 2023-03-04 DIAGNOSIS — M48062 Spinal stenosis, lumbar region with neurogenic claudication: Secondary | ICD-10-CM | POA: Diagnosis not present

## 2023-03-04 DIAGNOSIS — M5416 Radiculopathy, lumbar region: Secondary | ICD-10-CM | POA: Diagnosis not present

## 2023-03-18 DIAGNOSIS — Z6820 Body mass index (BMI) 20.0-20.9, adult: Secondary | ICD-10-CM | POA: Diagnosis not present

## 2023-03-18 DIAGNOSIS — Z2089 Contact with and (suspected) exposure to other communicable diseases: Secondary | ICD-10-CM | POA: Diagnosis not present

## 2023-03-18 DIAGNOSIS — J101 Influenza due to other identified influenza virus with other respiratory manifestations: Secondary | ICD-10-CM | POA: Diagnosis not present

## 2023-03-18 DIAGNOSIS — R509 Fever, unspecified: Secondary | ICD-10-CM | POA: Diagnosis not present

## 2023-03-30 DIAGNOSIS — Z20822 Contact with and (suspected) exposure to covid-19: Secondary | ICD-10-CM | POA: Diagnosis not present

## 2023-03-30 DIAGNOSIS — R509 Fever, unspecified: Secondary | ICD-10-CM | POA: Diagnosis not present

## 2023-03-30 DIAGNOSIS — J069 Acute upper respiratory infection, unspecified: Secondary | ICD-10-CM | POA: Diagnosis not present

## 2023-04-22 DIAGNOSIS — M48062 Spinal stenosis, lumbar region with neurogenic claudication: Secondary | ICD-10-CM | POA: Diagnosis not present

## 2023-05-19 ENCOUNTER — Encounter: Payer: Self-pay | Admitting: *Deleted

## 2023-05-19 ENCOUNTER — Ambulatory Visit: Payer: PPO | Attending: Cardiology | Admitting: Cardiology

## 2023-05-19 ENCOUNTER — Encounter: Payer: Self-pay | Admitting: Cardiology

## 2023-05-19 VITALS — BP 118/62 | HR 56 | Ht 64.0 in | Wt 114.2 lb

## 2023-05-19 DIAGNOSIS — R002 Palpitations: Secondary | ICD-10-CM

## 2023-05-19 DIAGNOSIS — I951 Orthostatic hypotension: Secondary | ICD-10-CM | POA: Diagnosis not present

## 2023-05-19 NOTE — Patient Instructions (Signed)

## 2023-05-19 NOTE — Progress Notes (Signed)
 Clinical Summary Tina Schultz is a 78 y.o.female seen today for follow up of the following medical problems.    1. Palpitations - 04/2016 normal heart monitor  - 10/2018 heart monitor rare supraventriuclar and ventricular ectopy. Rare short runs atach. Min HR 54, Max HR 109, Avg HR 68  Jan 2022 monitor: Rare supraventicular ectopy, occasional ventricular ectopy. Short runs of SVT up to 15 beats.   - some palpitations at times, 3-4 times per week - overall not bothersome     2. CAD? - CAD is mentioned in her PMH. She unclear of this diagnosis - previous at [facility] admission back in 1980s for heart cath she reports she was told to be normal  - no recent chest pains.      3. Orthostatic hypotension - prior clinic visit 38 point drop in SBP with standing.  --has been on midodrine. Reported fatigue on midodrine 2.5mg  bid, she has been taking 2.5mg  in the AM and just 1.25mg  in the pm - mild dizzziness at times, overall tolerable.       4 polyneuraligia,          SH: her husband is Nurse, mental health, also a patient of mine 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   Depression    Diabetes mellitus without complication (H[facility])    Dysrhythmia    Fibromyalgia    GERD (gastroesophageal reflux disease)    Glaucoma    Hypertension    Hypothyroidism    IBS (irritable bowel syndrome)    Polymyalgia rheumatica ([facility])    Rheumatic fever    Stroke ([facility]) 1991  1993   left sided weakness; memorey loss   Thyroid disease    Ulcerative colitis ([facility])    per patient diagnosed at age 4     Allergies  Allergen Reactions   Iodinated Contrast Media Nausea And Vomiting and Other (See Comments)    Increased heart rate and sweating "a long time ago."  01/25/14 pt states she "went out" after receiving iv contrast 25 yrs ago and was told by [facility] to never have it  again  Other Reaction(s): GI Intolerance, Other (See Comments)   Aspirin Other (See Comments)    Bleeding precautions secondary to ulcerative colitis.   Codeine Nausea And Vomiting    Other Reaction(s): GI Intolerance   Morphine And Codeine Nausea And Vomiting   Sulfa Antibiotics Nausea And Vomiting and Nausea Only    Other Reaction(s): GI Intolerance   Benadryl [Diphenhydramine] Other (See Comments)    Makes me feel like I'm having a seizure.   Morphine     Other Reaction(s): GI Intolerance   Nsaids     Other Reaction(s): GI Intolerance     Current Outpatient Medications  Medication Sig Dispense Refill   alendronate (FOSAMAX) 70 MG tablet Take 70 mg by mouth every Wednesday. Take with a full glass of water on an empty stomach.     atenolol (TENORMIN) 25 MG tablet TAKE ONE TABLET BY MOUTH TWICE DAILY *dose changed start with NEXT fill 180 tablet 1   Azelastine HCl 137 MCG/SPRAY SOLN Place 1 spray into both nostrils 2 (two) times daily. (Patient not taking: Reported on 10/16/2021)     Camphor-Menthol-Methyl Sal (SALONPAS) 3.02-10-08 % PTCH Apply 1 packet topically daily as needed (Back pain).  cycloSPORINE (RESTASIS) 0.05 % ophthalmic emulsion Place 1 drop into both eyes daily.     diphenhydramine-acetaminophen (TYLENOL PM) 25-500 MG TABS tablet Take 1 tablet by mouth at bedtime.     estradiol (ESTRACE) 0.5 MG tablet Take 0.5 mg by mouth at bedtime.      gabapentin (NEURONTIN) 600 MG tablet Take 600 mg by mouth 2 (two) times daily.      HYDROcodone-acetaminophen (NORCO/VICODIN) 5-325 MG tablet Take 1 tablet by mouth every 6 (six) hours as needed for moderate pain.     latanoprost (XALATAN) 0.005 % ophthalmic solution Place 1 drop into both eyes at bedtime.     levothyroxine (SYNTHROID) 88 MCG tablet Take 88 mcg by mouth daily before breakfast.     midodrine (PROAMATINE) 2.5 MG tablet TAKE 1 TABLET (2.5 MG TOTAL) BY MOUTH 2 (TWO) TIMES DAILY WITH A MEAL. 180 tablet 2   omeprazole  (PRILOSEC) 20 MG capsule Take 20 mg by mouth daily.     predniSONE (DELTASONE) 5 MG tablet Take 6 mg by mouth daily.     Probiotic Product (PROBIOTIC DAILY PO) Take by mouth daily.     SUMAtriptan (IMITREX) 100 MG tablet Take 100 mg by mouth 2 (two) times daily as needed.     venlafaxine (EFFEXOR) 75 MG tablet Take 75 mg by mouth daily.     No current facility-administered medications for this visit.   Facility-Administered Medications Ordered in Other Visits  Medication Dose Route Frequency Provider Last Rate Last Admin   ondansetron (ZOFRAN) 4 mg in sodium chloride 0.9 % 50 mL IVPB  4 mg Intravenous Q6H PRN Coletta Memos, MD         Past Surgical History:  Procedure Laterality Date   ABDOMINAL HYSTERECTOMY     BIOPSY  06/03/2019   Procedure: BIOPSY;  Surgeon: Corbin Ade, MD;  [facility];  Service: Endoscopy;;   CATARACT EXTRACTION W/PHACO Right 10/15/2016   Procedure: CATARACT EXTRACTION PHACO AND INTRAOCULAR LENS PLACEMENT ([facility]);  Surgeon: Jethro Bolus, MD;  Location: [facility];  Service: Ophthalmology;  Laterality: Right;  CDE: 7.56   CATARACT EXTRACTION W/PHACO Left 01/14/2017   Procedure: CATARACT EXTRACTION PHACO AND INTRAOCULAR LENS PLACEMENT ([facility]);  Surgeon: Jethro Bolus, MD;  Location: [facility];  Service: Ophthalmology;  Laterality: Left;  CDE: 6.28   CHOLECYSTECTOMY     COLONOSCOPY  2013   Dr. Teena Dunk: mild left-sided diverticulosis, multiple biopsies with path unremarkable.    COLONOSCOPY WITH PROPOFOL N/A 06/03/2019   pancolonic diverticulosis, normal TI. S/p biopsies. Benign.    ELBOW DEBRIDEMENT Right    THYROID SURGERY     partial thyroidectomy age 65.     Allergies  Allergen Reactions   Iodinated Contrast Media Nausea And Vomiting and Other (See Comments)    Increased heart rate and sweating "a long time ago."  01/25/14 pt states she "went out" after receiving iv contrast 25 yrs ago and was told by [facility] to never have it again  Other  Reaction(s): GI Intolerance, Other (See Comments)   Aspirin Other (See Comments)    Bleeding precautions secondary to ulcerative colitis.   Codeine Nausea And Vomiting    Other Reaction(s): GI Intolerance   Morphine And Codeine Nausea And Vomiting   Sulfa Antibiotics Nausea And Vomiting and Nausea Only    Other Reaction(s): GI Intolerance   Benadryl [Diphenhydramine] Other (See Comments)    Makes me feel like I'm having a seizure.   Morphine     Other  Reaction(s): GI Intolerance   Nsaids     Other Reaction(s): GI Intolerance      Family History  Problem Relation Age of Onset   Cancer Mother    Stroke Father    Addison's disease Sister    Alzheimer's disease Brother    Colon cancer Niece        deceased at 59      Social History Ms. Shingler reports that she quit smoking about 33 years ago. Her smoking use included cigarettes. She has never been exposed to tobacco smoke. She has never used smokeless tobacco. Ms. Badour reports no history of alcohol use.    Physical Examination Today's Vitals   05/19/23 1033  BP: 118/62  Pulse: (!) 56  SpO2: 96%  Weight: 114 lb 3.2 oz (51.8 kg)  Height: 5\' 4"  (1.626 m)   Body mass index is 19.6 kg/m.  Gen: resting comfortably, no acute distress HEENT: no scleral icterus, pupils equal round and reactive, no palptable cervical adenopathy,  CV: RRR, no mrg, no jvd Resp: Clear to auscultation bilaterally GI: abdomen is soft, non-tender, non-distended, normal bowel sounds, no hepatosplenomegaly MSK: extremities are warm, no edema.  Skin: warm, no rash Neuro:  no focal deficits Psych: appropriate affect   Diagnostic Studies  04/2016 heart monitor Telemetry tracings show normal sinus rhythm No symptoms reported No significant arrhythmias   Jan 2022 monitor Predominant rhythm is sinus rhythm Rare supraventicular ectopy, occasional ventriculare ectopy. Short runs of SVT up to 15 beats. No symptoms reported     Patch Wear Time:   12 days and 6 hours (2022-01-07T10:56:47-0500 to 2022-01-19T17:44:18-0500)   Patient had a min HR of 47 bpm, max HR of 176 bpm, and avg HR of 68 bpm. Predominant underlying rhythm was Sinus Rhythm. 50 Supraventricular Tachycardia runs occurred, the run with the fastest interval lasting 13 beats with a max rate of 176 bpm, the  longest lasting 15 beats with an avg rate of 115 bpm. Isolated SVEs were rare (<1.0%), SVE Couplets were rare (<1.0%), and SVE Triplets were rare (<1.0%). Isolated VEs were occasional (1.9%, 21004), VE Couplets were rare (<1.0%, 65), and VE Triplets were  rare (<1.0%, 1). Ventricular Bigeminy and Trigeminy were present.      Assessment and Plan  1. Palpitations -mild infrequent symptoms overall tolerable - continue current meds - EKG today shows sinus brady at 56   2.Orthostatic hypotension - mild tolerable symptoms,continue current therapy   F/u 1 year, request pcp labs     Laurann Pollock, M.D.

## 2023-05-20 ENCOUNTER — Encounter: Payer: Self-pay | Admitting: Cardiology

## 2023-06-06 DIAGNOSIS — E7849 Other hyperlipidemia: Secondary | ICD-10-CM | POA: Diagnosis not present

## 2023-06-06 DIAGNOSIS — Z1321 Encounter for screening for nutritional disorder: Secondary | ICD-10-CM | POA: Diagnosis not present

## 2023-06-06 DIAGNOSIS — E039 Hypothyroidism, unspecified: Secondary | ICD-10-CM | POA: Diagnosis not present

## 2023-06-06 DIAGNOSIS — R42 Dizziness and giddiness: Secondary | ICD-10-CM | POA: Diagnosis not present

## 2023-06-06 DIAGNOSIS — D559 Anemia due to enzyme disorder, unspecified: Secondary | ICD-10-CM | POA: Diagnosis not present

## 2023-06-06 DIAGNOSIS — E1165 Type 2 diabetes mellitus with hyperglycemia: Secondary | ICD-10-CM | POA: Diagnosis not present

## 2023-06-06 DIAGNOSIS — Z13220 Encounter for screening for lipoid disorders: Secondary | ICD-10-CM | POA: Diagnosis not present

## 2023-06-06 DIAGNOSIS — I10 Essential (primary) hypertension: Secondary | ICD-10-CM | POA: Diagnosis not present

## 2023-06-12 DIAGNOSIS — M353 Polymyalgia rheumatica: Secondary | ICD-10-CM | POA: Diagnosis not present

## 2023-06-12 DIAGNOSIS — Z1389 Encounter for screening for other disorder: Secondary | ICD-10-CM | POA: Diagnosis not present

## 2023-06-12 DIAGNOSIS — Z681 Body mass index (BMI) 19 or less, adult: Secondary | ICD-10-CM | POA: Diagnosis not present

## 2023-06-12 DIAGNOSIS — Z23 Encounter for immunization: Secondary | ICD-10-CM | POA: Diagnosis not present

## 2023-06-12 DIAGNOSIS — M4807 Spinal stenosis, lumbosacral region: Secondary | ICD-10-CM | POA: Diagnosis not present

## 2023-06-12 DIAGNOSIS — Z1331 Encounter for screening for depression: Secondary | ICD-10-CM | POA: Diagnosis not present

## 2023-06-12 DIAGNOSIS — E039 Hypothyroidism, unspecified: Secondary | ICD-10-CM | POA: Diagnosis not present

## 2023-06-12 DIAGNOSIS — Z0001 Encounter for general adult medical examination with abnormal findings: Secondary | ICD-10-CM | POA: Diagnosis not present

## 2023-06-12 DIAGNOSIS — I10 Essential (primary) hypertension: Secondary | ICD-10-CM | POA: Diagnosis not present

## 2023-06-17 ENCOUNTER — Ambulatory Visit: Admitting: Gastroenterology

## 2023-06-19 ENCOUNTER — Other Ambulatory Visit: Payer: Self-pay | Admitting: Cardiology

## 2023-06-25 ENCOUNTER — Ambulatory Visit (INDEPENDENT_AMBULATORY_CARE_PROVIDER_SITE_OTHER): Admitting: Gastroenterology

## 2023-06-25 ENCOUNTER — Telehealth: Payer: Self-pay | Admitting: Gastroenterology

## 2023-06-25 ENCOUNTER — Telehealth: Payer: Self-pay | Admitting: *Deleted

## 2023-06-25 ENCOUNTER — Encounter: Payer: Self-pay | Admitting: Gastroenterology

## 2023-06-25 VITALS — BP 115/55 | HR 52 | Temp 97.5°F | Ht 64.0 in | Wt 112.3 lb

## 2023-06-25 DIAGNOSIS — K625 Hemorrhage of anus and rectum: Secondary | ICD-10-CM | POA: Insufficient documentation

## 2023-06-25 DIAGNOSIS — R634 Abnormal weight loss: Secondary | ICD-10-CM | POA: Diagnosis not present

## 2023-06-25 DIAGNOSIS — Z8719 Personal history of other diseases of the digestive system: Secondary | ICD-10-CM | POA: Diagnosis not present

## 2023-06-25 DIAGNOSIS — K5900 Constipation, unspecified: Secondary | ICD-10-CM | POA: Diagnosis not present

## 2023-06-25 MED ORDER — PEG 3350-KCL-NA BICARB-NACL 420 G PO SOLR: 4000.0000 mL | Freq: Once | ORAL | 0 refills | Status: AC

## 2023-06-25 NOTE — Telephone Encounter (Signed)
SEE PRIOR NOTE 

## 2023-06-25 NOTE — Patient Instructions (Signed)
 We are arranging a colonoscopy and upper endoscopy with Dr. Riley Cheadle in the near future.  I have also given you samples of Linzess to try. Linzess works best when taken once a day every day, on an empty stomach, at least 30 minutes before your first meal of the day.  When Linzess is taken daily as directed:  *Constipation relief is typically felt in about a week *IBS-C patients may begin to experience relief from belly pain and overall abdominal symptoms (pain, discomfort, and bloating) in about 1 week,   with symptoms typically improving over 12 weeks.  Diarrhea may occur in the first 2 weeks -keep taking it.  The diarrhea should go away and you should start having normal, complete, full bowel movements. It may be helpful to start treatment when you can be near the comfort of your own bathroom, such as a weekend.   Please let me know how this works for you, so that we make sure you are having good bowel movements prior to the colonoscopy!   We will see you in 3 months!  I enjoyed seeing you again today! I value our relationship and want to provide genuine, compassionate, and quality care. You may receive a survey regarding your visit with me, and I welcome your feedback! Thanks so much for taking the time to complete this. I look forward to seeing you again.      Delman Ferns, PhD, ANP-BC [facility]

## 2023-06-25 NOTE — Telephone Encounter (Signed)
 Spoke with pt. She has been scheduled for 7/2. Aware will call back with pre-op appt. Instructions will be mailed. Rx for prep sent to pharmacy

## 2023-06-25 NOTE — Telephone Encounter (Signed)
 Pt returning call. [phone]

## 2023-06-25 NOTE — Telephone Encounter (Signed)
 lMOVM to call back to schedule TCS/EGD with Dr. Riley Cheadle, ASA 3 in 4 weeks

## 2023-06-25 NOTE — Progress Notes (Unsigned)
 Gastroenterology Office Note    Referring Provider: Leesa Pulling, MD Primary Care Physician:  Leesa Pulling, MD  Primary GI: Dr. Riley Cheadle    Chief Complaint   Chief Complaint  Patient presents with   Rectal Bleeding    Pt arrives due to recently passing blood in stool about 2 month. Has recently stopped about one month ago. Dark red almost black, small amount. Pt also have stomach issues. Does state have nausea at times.      History of Present Illness   Tina Schultz is a 78 y.o. female presenting today at the request of Leesa Pulling, MD due to   So about 2 months worth of dark stool. Only happened twice a week. No hematochezia until the end and one ball was bright red. None in a month. No iron or pepto bismol   BM only twice a week. Associated lower abdominal pain. Improved  after BM. No OTC agents. Rectal burning.   +weight loss. No appetite. No postprandial abdominal pain. Stays nauseated but no vomiting. Omeprazole 20 mg once a day on an empty stomach. Chronic prednisone.   Remote EGD, unsure the date.    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   Depression    Diabetes mellitus without complication (HCC)    Dysrhythmia    Fibromyalgia    GERD (gastroesophageal reflux disease)    Glaucoma    Hypertension    Hypothyroidism    IBS (irritable bowel syndrome)    Polymyalgia rheumatica (HCC)    Rheumatic fever    Stroke (HCC) 1991  1993   left sided weakness; memorey loss   Thyroid  disease    Ulcerative colitis (HCC)    per patient diagnosed at age 70    Past Surgical History:  Procedure Laterality Date   ABDOMINAL HYSTERECTOMY     BIOPSY  06/03/2019   Procedure: BIOPSY;  Surgeon: Suzette Espy, MD;  Location: [facility];  Service: Endoscopy;;   CATARACT EXTRACTION W/PHACO Right 10/15/2016   Procedure: CATARACT EXTRACTION PHACO AND  INTRAOCULAR LENS PLACEMENT (IOC);  Surgeon: Albert Huff, MD;  Location: [facility];  Service: Ophthalmology;  Laterality: Right;  CDE: 7.56   CATARACT EXTRACTION W/PHACO Left 01/14/2017   Procedure: CATARACT EXTRACTION PHACO AND INTRAOCULAR LENS PLACEMENT (IOC);  Surgeon: Albert Huff, MD;  Location: [facility];  Service: Ophthalmology;  Laterality: Left;  CDE: 6.28   CHOLECYSTECTOMY     COLONOSCOPY  2013   Dr. Alline Ivans: mild left-sided diverticulosis, multiple biopsies with path unremarkable.    COLONOSCOPY WITH PROPOFOL  N/A 06/03/2019   pancolonic diverticulosis, normal TI. S/p biopsies. Benign.    ELBOW DEBRIDEMENT Right    THYROID  SURGERY     partial thyroidectomy age 64.    Current Outpatient Medications  Medication Sig Dispense Refill   cycloSPORINE (RESTASIS) 0.05 % ophthalmic emulsion Place 1 drop into both eyes daily.     denosumab  (PROLIA ) 60 MG/ML SOSY injection Inject 60 mg into the skin every 6 (six) months.     diphenhydramine-acetaminophen (TYLENOL PM) 25-500 MG TABS tablet Take 1 tablet by mouth at bedtime.     estradiol (ESTRACE) 0.5 MG tablet Take 0.5 mg by mouth at bedtime.      folic acid (FOLVITE) 1 MG tablet Take 1 mg by mouth daily.  HYDROcodone-acetaminophen (NORCO/VICODIN) 5-325 MG tablet Take 1 tablet by mouth every 6 (six) hours as needed for moderate pain.     latanoprost (XALATAN) 0.005 % ophthalmic solution Place 1 drop into both eyes at bedtime.     levothyroxine (SYNTHROID) 88 MCG tablet Take 88 mcg by mouth daily before breakfast.     midodrine  (PROAMATINE ) 2.5 MG tablet TAKE 1 TABLET (2.5 MG TOTAL) BY MOUTH 2 (TWO) TIMES DAILY WITH A MEAL. 180 tablet 3   omeprazole (PRILOSEC) 20 MG capsule Take 20 mg by mouth daily.     predniSONE (DELTASONE) 5 MG tablet Take 4 mg by mouth daily.     Probiotic Product (PROBIOTIC DAILY PO) Take by mouth daily.     SUMAtriptan (IMITREX) 100 MG tablet Take 100 mg by mouth 2 (two) times daily as needed.     venlafaxine (EFFEXOR)  75 MG tablet Take 75 mg by mouth daily.     alendronate (FOSAMAX) 70 MG tablet Take 70 mg by mouth every Wednesday. Take with a full glass of water on an empty stomach.     atenolol  (TENORMIN ) 25 MG tablet TAKE ONE TABLET BY MOUTH TWICE DAILY *dose changed start with NEXT fill 180 tablet 1   Azelastine HCl 137 MCG/SPRAY SOLN Place 1 spray into both nostrils 2 (two) times daily. (Patient not taking: Reported on 05/19/2023)     No current facility-administered medications for this visit.   Facility-Administered Medications Ordered in Other Visits  Medication Dose Route Frequency Provider Last Rate Last Admin   ondansetron  (ZOFRAN ) 4 mg in sodium chloride  0.9 % 50 mL IVPB  4 mg Intravenous Q6H PRN Audie Bleacher, MD        Allergies as of 06/25/2023 - Review Complete 06/25/2023  Allergen Reaction Noted   Iodinated contrast media Nausea And Vomiting and Other (See Comments) 03/15/2013   Aspirin Other (See Comments) 03/15/2013   Codeine Nausea And Vomiting 03/15/2013   Morphine and codeine Nausea And Vomiting 03/15/2013   Sulfa antibiotics Nausea And Vomiting and Nausea Only 03/15/2013   Benadryl [diphenhydramine] Other (See Comments) 01/04/2020   Morphine  03/14/2015   Nsaids  05/10/2020    Family History  Problem Relation Age of Onset   Cancer Mother    Stroke Father    Addison's disease Sister    Alzheimer's disease Brother    Colon cancer Niece        deceased at 3     Social History   Socioeconomic History   Marital status: Married    Spouse name: Not on file   Number of children: Not on file   Years of education: Not on file   Highest education level: Not on file  Occupational History   Occupation: retired    Comment: [employer], cook  Tobacco Use   Smoking status: Former    Current packs/day: 0.00    Types: Cigarettes    Quit date: 12/08/1989    Years since quitting: 33.5    Passive exposure: Never   Smokeless tobacco: Never  Vaping Use   Vaping status:  Never Used  Substance and Sexual Activity   Alcohol  use: No   Drug use: No   Sexual activity: Never  Other Topics Concern   Not on file  Social History Narrative   Not on file   Social Drivers of Health   Financial Resource Strain: Not on file  Food Insecurity: Not on file  Transportation Needs: Not on file  Physical Activity: Not  on file  Stress: Not on file  Social Connections: Not on file  Intimate Partner Violence: Not on file     Review of Systems   Gen: Denies any fever, chills, fatigue, weight loss, lack of appetite.  CV: Denies chest pain, heart palpitations, peripheral edema, syncope.  Resp: Denies shortness of breath at rest or with exertion. Denies wheezing or cough.  GI: Denies dysphagia or odynophagia. Denies jaundice, hematemesis, fecal incontinence. GU : Denies urinary burning, urinary frequency, urinary hesitancy MS: Denies joint pain, muscle weakness, cramps, or limitation of movement.  Derm: Denies rash, itching, dry skin Psych: Denies depression, anxiety, memory loss, and confusion Heme: Denies bruising, bleeding, and enlarged lymph nodes.   Physical Exam   BP (!) 115/55   Pulse (!) 52   Temp (!) 97.5 F (36.4 C)   Ht 5\' 4"  (1.626 m)   Wt 112 lb 4.8 oz (50.9 kg)   BMI 19.28 kg/m  General:   Alert and oriented. Pleasant and cooperative. Well-nourished and well-developed.  Head:  Normocephalic and atraumatic. Eyes:  Without icterus Ears:  Normal auditory acuity. Lungs:  Clear to auscultation bilaterally.  Heart:  S1, S2 present without murmurs appreciated.  Abdomen:  +BS, soft, non-tender and non-distended. No HSM noted. No guarding or rebound. No masses appreciated.  Rectal:  small external hemorrhoid tags, DRE without obvious mass, fair sphincter tone Msk:  Symmetrical without gross deformities. Normal posture. Extremities:  Without edema. Neurologic:  Alert and  oriented x4;  grossly normal neurologically. Skin:  Intact without significant  lesions or rashes. Psych:  Alert and cooperative. Normal mood and affect.   Assessment   Tina Schultz is a 78 y.o. female presenting today with    Could have OIC as well    PLAN     Delman Ferns, PhD, ANP-BC [facility]

## 2023-06-26 NOTE — Telephone Encounter (Signed)
Called pt and she is aware of pre-op appt details. She voiced understanding. 

## 2023-07-02 ENCOUNTER — Telehealth: Payer: Self-pay | Admitting: Gastroenterology

## 2023-07-02 NOTE — Telephone Encounter (Signed)
 Phoned the pt and was advised by her she is doing better that she has a good BM every other day. I advised her that we can increase to make sure she is good and cleaned out for colonoscopy in July. Pt states no she doesn't want you to increase the medication.

## 2023-07-02 NOTE — Telephone Encounter (Signed)
 Can we reach out to patient and see how she is doing on the Linzess 145 mcg? We may need to increase this. Need to ensure having good BMs prior to upcoming colonoscopy in July.

## 2023-07-07 ENCOUNTER — Telehealth (INDEPENDENT_AMBULATORY_CARE_PROVIDER_SITE_OTHER): Payer: Self-pay | Admitting: *Deleted

## 2023-07-07 NOTE — Telephone Encounter (Signed)
 Error

## 2023-07-09 MED ORDER — LINACLOTIDE 145 MCG PO CAPS: 145.0000 ug | ORAL_CAPSULE | Freq: Every day | ORAL | 5 refills | Status: DC

## 2023-07-09 NOTE — Telephone Encounter (Signed)
 I sent in Linzess for her.

## 2023-07-09 NOTE — Addendum Note (Signed)
 Addended by: Delman Ferns on: 07/09/2023 12:32 PM   Modules accepted: Orders

## 2023-07-09 NOTE — Telephone Encounter (Signed)
 Phoned and LMOVM of the pt that Karna Pacas, NP had sent in her Rx for Linzess 145mcg to her pharmacy and she can pick it up

## 2023-07-10 ENCOUNTER — Telehealth: Payer: Self-pay

## 2023-07-10 NOTE — Telephone Encounter (Signed)
 Linzess is on her formulary, but it looks to be a tier 3. Other options on formulary are lactulose, but this is also a tier 3.   She will need to start Miralax 1 capful twice a day. Please have her call back next week with a progress report.

## 2023-07-10 NOTE — Telephone Encounter (Signed)
 Pt phoned stating that the Linzess is to expensive and she needed something else phoned in. The pt's formulary on your desk

## 2023-07-11 NOTE — Telephone Encounter (Signed)
 Phoned the pt and advised of the medications (formulary) and instructions to using the Miralax and to call us  next week with an update of how she is doing. Pt expressed understanding.

## 2023-07-30 ENCOUNTER — Telehealth: Payer: Self-pay | Admitting: *Deleted

## 2023-07-30 NOTE — Telephone Encounter (Signed)
 Daughter Tina Schultz called in to reschedule procedure 7/2 with Dr. Shaaron. Patient moved to 7/21. Aware will call back with new pre-op appt. Will also send new instructions.

## 2023-07-30 NOTE — Telephone Encounter (Signed)
 Called daughter wendy and she is aware of new pre-op appt details

## 2023-08-05 ENCOUNTER — Encounter (HOSPITAL_COMMUNITY)

## 2023-08-07 DIAGNOSIS — M5416 Radiculopathy, lumbar region: Secondary | ICD-10-CM | POA: Diagnosis not present

## 2023-08-12 DIAGNOSIS — M5416 Radiculopathy, lumbar region: Secondary | ICD-10-CM | POA: Diagnosis not present

## 2023-08-19 NOTE — Patient Instructions (Signed)
 Tina Schultz  08/19/2023     @PREFPERIOPPHARMACY @   Your procedure is scheduled on  08/25/2023.   [facility] to [facility] at  1045  A.M.   Call this number if you have problems the morning of surgery:  [phone]  If you experience any cold or flu symptoms such as cough, fever, chills, shortness of breath, etc. between now and your scheduled surgery, please notify us  at the above number.   Remember:        DO NOT take any medications for diabetes the morning of your procedure.   Follow the diet and prep instructions given to you by the office.    You may drink clear liquids until  0845 am on 08/25/2023.    Clear liquids allowed are:                    Water, Juice (No red color; non-citric and without pulp; diabetics please choose diet or no sugar options), Carbonated beverages (diabetics please choose diet or no sugar options), Clear Tea (No creamer, milk, or cream, including half & half and powdered creamer), Black Coffee Only (No creamer, milk or cream, including half & half and powdered creamer), and Clear Sports drink (No red color; diabetics please choose diet or no sugar options)    Take these medicines the morning of surgery with A SIP OF WATER           atenolol , hydrocodone(if needed), levothyroxine, midodrine , prednisone, sumatriptan(if needed), venlafaxine.    Do not wear jewelry, make-up or nail polish, including gel polish,  artificial nails, or any other type of covering on natural nails (fingers and  toes).  Do not wear lotions, powders, or perfumes, or deodorant.  Do not shave 48 hours prior to surgery.  Men may shave face and neck.  Do not bring valuables to the hospital.  [facility] is not responsible for any belongings or valuables.  Contacts, dentures or bridgework may not be worn into surgery.  Leave your suitcase in the car.  After surgery it may be brought to your room.  For patients admitted to the hospital, discharge time will be  determined by your treatment team.  Patients discharged the day of surgery will not be allowed to drive home and must have someone with them for 24 hours.    Special instructions:   DO NOT smoke tobacco or vape for 24 hours before your procedure.  Please read over the following fact sheets that you were given. Anesthesia Post-op Instructions and Care and Recovery After Surgery      Upper Endoscopy, Adult, Care After After the procedure, it is common to have a sore throat. It is also common to have: Mild stomach pain or discomfort. Bloating. Nausea. Follow these instructions at home: The instructions below may help you care for yourself at home. Your health care provider may give you more instructions. If you have questions, ask your health care provider. If you were given a sedative during the procedure, it can affect you for several hours. Do not drive or operate machinery until your health care provider says that it is safe. If you will be going home right after the procedure, plan to have a responsible adult: Take you home from the hospital or clinic. You will not be allowed to drive. Care for you for the time you are told. Follow instructions from your health care provider about what you may eat  and drink. Return to your normal activities as told by your health care provider. Ask your health care provider what activities are safe for you. Take over-the-counter and prescription medicines only as told by your health care provider. Contact a health care provider if you: Have a sore throat that lasts longer than one day. Have trouble swallowing. Have a fever. Get help right away if you: Vomit blood or your vomit looks like coffee grounds. Have bloody, black, or tarry stools. Have a very bad sore throat or you cannot swallow. Have difficulty breathing or very bad pain in your chest or abdomen. These symptoms may be an emergency. Get help right away. Call 911. Do not wait to see if  the symptoms will go away. Do not drive yourself to the hospital. Summary After the procedure, it is common to have a sore throat, mild stomach discomfort, bloating, and nausea. If you were given a sedative during the procedure, it can affect you for several hours. Do not drive until your health care provider says that it is safe. Follow instructions from your health care provider about what you may eat and drink. Return to your normal activities as told by your health care provider. This information is not intended to replace advice given to you by your health care provider. Make sure you discuss any questions you have with your health care provider. Document Revised: 05/02/2021 Document Reviewed: 05/02/2021 Elsevier Patient Education  2024 Elsevier [employer].Colonoscopy, Adult, Care After The following information offers guidance on how to care for yourself after your procedure. Your health care provider may also give you more specific instructions. If you have problems or questions, contact your health care provider. What can I expect after the procedure? After the procedure, it is common to have: A small amount of blood in your stool for 24 hours after the procedure. Some gas. Mild cramping or bloating of your abdomen. Follow these instructions at home: Eating and drinking  Drink enough fluid to keep your urine pale yellow. Follow instructions from your health care provider about eating or drinking restrictions. Resume your normal diet as told by your health care provider. Avoid heavy or fried foods that are hard to digest. Activity Rest as told by your health care provider. Avoid sitting for a long time without moving. Get up to take short walks every 1-2 hours. This is important to improve blood flow and breathing. Ask for help if you feel weak or unsteady. Return to your normal activities as told by your health care provider. Ask your health care provider what activities are safe for  you. Managing cramping and bloating  Try walking around when you have cramps or feel bloated. If directed, apply heat to your abdomen as told by your health care provider. Use the heat source that your health care provider recommends, such as a moist heat pack or a heating pad. Place a towel between your skin and the heat source. Leave the heat on for 20-30 minutes. Remove the heat if your skin turns bright red. This is especially important if you are unable to feel pain, heat, or cold. You have a greater risk of getting burned. General instructions If you were given a sedative during the procedure, it can affect you for several hours. Do not drive or operate machinery until your health care provider says that it is safe. For the first 24 hours after the procedure: Do not sign important documents. Do not drink alcohol . Do your regular daily activities at  a slower pace than normal. Eat soft foods that are easy to digest. Take over-the-counter and prescription medicines only as told by your health care provider. Keep all follow-up visits. This is important. Contact a health care provider if: You have blood in your stool 2-3 days after the procedure. Get help right away if: You have more than a small spotting of blood in your stool. You have large blood clots in your stool. You have swelling of your abdomen. You have nausea or vomiting. You have a fever. You have increasing pain in your abdomen that is not relieved with medicine. These symptoms may be an emergency. Get help right away. Call 911. Do not wait to see if the symptoms will go away. Do not drive yourself to the hospital. Summary After the procedure, it is common to have a small amount of blood in your stool. You may also have mild cramping and bloating of your abdomen. If you were given a sedative during the procedure, it can affect you for several hours. Do not drive or operate machinery until your health care provider says  that it is safe. Get help right away if you have a lot of blood in your stool, nausea or vomiting, a fever, or increased pain in your abdomen. This information is not intended to replace advice given to you by your health care provider. Make sure you discuss any questions you have with your health care provider. Document Revised: 03/05/2022 Document Reviewed: 09/13/2020 Elsevier Patient Education  2024 Elsevier Inc.General Anesthesia, Adult, Care After The following information offers guidance on how to care for yourself after your procedure. Your health care provider may also give you more specific instructions. If you have problems or questions, contact your health care provider. What can I expect after the procedure? After the procedure, it is common for people to: Have pain or discomfort at the IV site. Have nausea or vomiting. Have a sore throat or hoarseness. Have trouble concentrating. Feel cold or chills. Feel weak, sleepy, or tired (fatigue). Have soreness and body aches. These can affect parts of the body that were not involved in surgery. Follow these instructions at home: For the time period you were told by your health care provider:  Rest. Do not participate in activities where you could fall or become injured. Do not drive or use machinery. Do not drink alcohol . Do not take sleeping pills or medicines that cause drowsiness. Do not make important decisions or sign legal documents. Do not take care of children on your own. General instructions Drink enough fluid to keep your urine pale yellow. If you have sleep apnea, surgery and certain medicines can increase your risk for breathing problems. Follow instructions from your health care provider about wearing your sleep device: Anytime you are sleeping, including during daytime naps. While taking prescription pain medicines, sleeping medicines, or medicines that make you drowsy. Return to your normal activities as told by  your health care provider. Ask your health care provider what activities are safe for you. Take over-the-counter and prescription medicines only as told by your health care provider. Do not use any products that contain nicotine or tobacco. These products include cigarettes, chewing tobacco, and vaping devices, such as e-cigarettes. These can delay incision healing after surgery. If you need help quitting, ask your health care provider. Contact a health care provider if: You have nausea or vomiting that does not get better with medicine. You vomit every time you eat or drink. You have pain  that does not get better with medicine. You cannot urinate or have bloody urine. You develop a skin rash. You have a fever. Get help right away if: You have trouble breathing. You have chest pain. You vomit blood. These symptoms may be an emergency. Get help right away. Call 911. Do not wait to see if the symptoms will go away. Do not drive yourself to the hospital. Summary After the procedure, it is common to have a sore throat, hoarseness, nausea, vomiting, or to feel weak, sleepy, or fatigue. For the time period you were told by your health care provider, do not drive or use machinery. Get help right away if you have difficulty breathing, have chest pain, or vomit blood. These symptoms may be an emergency. This information is not intended to replace advice given to you by your health care provider. Make sure you discuss any questions you have with your health care provider. Document Revised: 04/20/2021 Document Reviewed: 04/20/2021 Elsevier Patient Education  2024 [employer].

## 2023-08-20 ENCOUNTER — Encounter (HOSPITAL_COMMUNITY)
Admission: RE | Admit: 2023-08-20 | Discharge: 2023-08-20 | Disposition: A | Discharge status: Home / Self Care | Source: Ambulatory Visit | Attending: Internal Medicine | Admitting: Internal Medicine

## 2023-08-20 ENCOUNTER — Encounter (HOSPITAL_COMMUNITY): Payer: Self-pay

## 2023-08-20 ENCOUNTER — Other Ambulatory Visit: Payer: Self-pay

## 2023-08-20 VITALS — BP 143/60 | HR 59 | Resp 18 | Ht 64.0 in | Wt 112.2 lb

## 2023-08-20 DIAGNOSIS — E119 Type 2 diabetes mellitus without complications: Secondary | ICD-10-CM | POA: Insufficient documentation

## 2023-08-20 DIAGNOSIS — Z01818 Encounter for other preprocedural examination: Secondary | ICD-10-CM | POA: Insufficient documentation

## 2023-08-20 LAB — BASIC METABOLIC PANEL WITH GFR
Anion gap: 9 (ref 5–15)
BUN: 15 mg/dL (ref 8–23)
CO2: 26 mmol/L (ref 22–32)
Calcium: 9.2 mg/dL (ref 8.9–10.3)
Chloride: 101 mmol/L (ref 98–111)
Creatinine, Ser: 1.02 mg/dL — ABNORMAL HIGH (ref 0.44–1.00)
GFR, Estimated: 56 mL/min — ABNORMAL LOW (ref >60)
Glucose, Bld: 95 mg/dL (ref 70–99)
Potassium: 3.9 mmol/L (ref 3.5–5.1)
Sodium: 136 mmol/L (ref 135–145)

## 2023-08-25 ENCOUNTER — Encounter (HOSPITAL_COMMUNITY)
Admission: RE | Disposition: A | Discharge status: Home / Self Care | Payer: Self-pay | Source: Ambulatory Visit | Attending: Internal Medicine

## 2023-08-25 ENCOUNTER — Encounter (HOSPITAL_COMMUNITY): Payer: Self-pay | Admitting: Internal Medicine

## 2023-08-25 ENCOUNTER — Ambulatory Visit (HOSPITAL_COMMUNITY): Admitting: Anesthesiology

## 2023-08-25 ENCOUNTER — Other Ambulatory Visit: Payer: Self-pay

## 2023-08-25 ENCOUNTER — Ambulatory Visit (HOSPITAL_COMMUNITY)
Admission: RE | Admit: 2023-08-25 | Discharge: 2023-08-25 | Disposition: A | Discharge status: Home / Self Care | Source: Ambulatory Visit | Attending: Internal Medicine | Admitting: Internal Medicine

## 2023-08-25 DIAGNOSIS — Z87891 Personal history of nicotine dependence: Secondary | ICD-10-CM | POA: Insufficient documentation

## 2023-08-25 DIAGNOSIS — R110 Nausea: Secondary | ICD-10-CM | POA: Insufficient documentation

## 2023-08-25 DIAGNOSIS — K644 Residual hemorrhoidal skin tags: Secondary | ICD-10-CM | POA: Insufficient documentation

## 2023-08-25 DIAGNOSIS — I69311 Memory deficit following cerebral infarction: Secondary | ICD-10-CM | POA: Diagnosis not present

## 2023-08-25 DIAGNOSIS — I10 Essential (primary) hypertension: Secondary | ICD-10-CM | POA: Diagnosis not present

## 2023-08-25 DIAGNOSIS — F32A Depression, unspecified: Secondary | ICD-10-CM | POA: Insufficient documentation

## 2023-08-25 DIAGNOSIS — K920 Hematemesis: Secondary | ICD-10-CM

## 2023-08-25 DIAGNOSIS — I69354 Hemiplegia and hemiparesis following cerebral infarction affecting left non-dominant side: Secondary | ICD-10-CM | POA: Diagnosis not present

## 2023-08-25 DIAGNOSIS — F0390 Unspecified dementia without behavioral disturbance: Secondary | ICD-10-CM | POA: Diagnosis not present

## 2023-08-25 DIAGNOSIS — I2510 Atherosclerotic heart disease of native coronary artery without angina pectoris: Secondary | ICD-10-CM

## 2023-08-25 DIAGNOSIS — K649 Unspecified hemorrhoids: Secondary | ICD-10-CM | POA: Diagnosis not present

## 2023-08-25 DIAGNOSIS — K219 Gastro-esophageal reflux disease without esophagitis: Secondary | ICD-10-CM | POA: Insufficient documentation

## 2023-08-25 DIAGNOSIS — R634 Abnormal weight loss: Secondary | ICD-10-CM | POA: Diagnosis not present

## 2023-08-25 DIAGNOSIS — Z681 Body mass index (BMI) 19 or less, adult: Secondary | ICD-10-CM | POA: Insufficient documentation

## 2023-08-25 DIAGNOSIS — K642 Third degree hemorrhoids: Secondary | ICD-10-CM | POA: Diagnosis not present

## 2023-08-25 DIAGNOSIS — E119 Type 2 diabetes mellitus without complications: Secondary | ICD-10-CM | POA: Insufficient documentation

## 2023-08-25 DIAGNOSIS — I499 Cardiac arrhythmia, unspecified: Secondary | ICD-10-CM | POA: Diagnosis not present

## 2023-08-25 DIAGNOSIS — F419 Anxiety disorder, unspecified: Secondary | ICD-10-CM | POA: Insufficient documentation

## 2023-08-25 DIAGNOSIS — K5730 Diverticulosis of large intestine without perforation or abscess without bleeding: Secondary | ICD-10-CM | POA: Insufficient documentation

## 2023-08-25 DIAGNOSIS — K921 Melena: Secondary | ICD-10-CM | POA: Insufficient documentation

## 2023-08-25 DIAGNOSIS — E039 Hypothyroidism, unspecified: Secondary | ICD-10-CM | POA: Insufficient documentation

## 2023-08-25 DIAGNOSIS — Z8711 Personal history of peptic ulcer disease: Secondary | ICD-10-CM | POA: Insufficient documentation

## 2023-08-25 HISTORY — PX: COLONOSCOPY: SHX5424

## 2023-08-25 HISTORY — PX: ESOPHAGOGASTRODUODENOSCOPY: SHX5428

## 2023-08-25 LAB — GLUCOSE, CAPILLARY: Glucose-Capillary: 98 mg/dL (ref 70–99)

## 2023-08-25 SURGERY — COLONOSCOPY
Anesthesia: General

## 2023-08-25 MED ORDER — PROPOFOL 10 MG/ML IV BOLUS
INTRAVENOUS | Status: DC | PRN
  Administered 2023-08-25: 60 mg via INTRAVENOUS
  Administered 2023-08-25: 40 mg via INTRAVENOUS
  Administered 2023-08-25: 30 mg via INTRAVENOUS
  Administered 2023-08-25: 125 ug/kg/min via INTRAVENOUS

## 2023-08-25 MED ORDER — EPHEDRINE SULFATE-NACL 50-0.9 MG/10ML-% IV SOSY
PREFILLED_SYRINGE | INTRAVENOUS | Status: DC | PRN
Start: 2023-08-25 — End: 2023-08-25
  Administered 2023-08-25: 5 mg via INTRAVENOUS
  Administered 2023-08-25: 10 mg via INTRAVENOUS

## 2023-08-25 MED ORDER — PHENYLEPHRINE 80 MCG/ML (10ML) SYRINGE FOR IV PUSH (FOR BLOOD PRESSURE SUPPORT)
PREFILLED_SYRINGE | INTRAVENOUS | Status: DC | PRN
  Administered 2023-08-25: 160 ug via INTRAVENOUS
  Administered 2023-08-25: 80 ug via INTRAVENOUS

## 2023-08-25 MED ORDER — LIDOCAINE 2% (20 MG/ML) 5 ML SYRINGE
INTRAMUSCULAR | Status: DC | PRN
  Administered 2023-08-25: 40 mg via INTRAVENOUS

## 2023-08-25 MED ORDER — LACTATED RINGERS IV SOLN: INTRAVENOUS | Status: DC

## 2023-08-25 NOTE — Anesthesia Preprocedure Evaluation (Signed)
 Anesthesia Evaluation  Patient identified by MRN, date of birth, ID band Patient awake    Reviewed: Allergy & Precautions, H&P , NPO status , Patient's Chart, lab work & pertinent test results, reviewed documented beta blocker date and time   Airway Mallampati: II  TM Distance: >3 FB Neck ROM: full    Dental no notable dental hx.    Pulmonary asthma , former smoker   Pulmonary exam normal breath sounds clear to auscultation       Cardiovascular Exercise Tolerance: Good hypertension, + CAD  + dysrhythmias  Rhythm:regular Rate:Normal     Neuro/Psych  PSYCHIATRIC DISORDERS Anxiety Depression   Dementia  Neuromuscular disease CVA    GI/Hepatic Neg liver ROS, PUD,GERD  ,,  Endo/Other  diabetesHypothyroidism    Renal/GU negative Renal ROS  negative genitourinary   Musculoskeletal   Abdominal   Peds  Hematology negative hematology ROS (+)   Anesthesia Other Findings   Reproductive/Obstetrics negative OB ROS                              Anesthesia Physical Anesthesia Plan  ASA: 3  Anesthesia Plan: General   Post-op Pain Management:    Induction:   PONV Risk Score and Plan: Propofol  infusion  Airway Management Planned:   Additional Equipment:   Intra-op Plan:   Post-operative Plan:   Informed Consent: I have reviewed the patients History and Physical, chart, labs and discussed the procedure including the risks, benefits and alternatives for the proposed anesthesia with the patient or authorized representative who has indicated his/her understanding and acceptance.     Dental Advisory Given  Plan Discussed with: CRNA  Anesthesia Plan Comments:         Anesthesia Quick Evaluation

## 2023-08-25 NOTE — H&P (Signed)
 @LOGO @   Primary Care Physician:  Toribio Jerel MATSU, MD Primary Gastroenterologist:  Dr.  Shaaron    78 year old lady with advanced dementia presents for further evaluation evaluation of rectal bleeding weight loss via EGD and colonoscopy.    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   Depression    Diabetes mellitus without complication (HCC)    Dysrhythmia    Fibromyalgia    GERD (gastroesophageal reflux disease)    Glaucoma    Hypertension    Hypothyroidism    IBS (irritable bowel syndrome)    Polymyalgia rheumatica (HCC)    Rheumatic fever    Stroke (HCC) 1991  1993   left sided weakness; memorey loss   Thyroid  disease    Ulcerative colitis (HCC)    per patient diagnosed at age 3    Past Surgical History:  Procedure Laterality Date   ABDOMINAL HYSTERECTOMY     BIOPSY  06/03/2019   Procedure: BIOPSY;  Surgeon: Shaaron Lamar HERO, MD;  Location: [facility];  Service: Endoscopy;;   CATARACT EXTRACTION W/PHACO Right 10/15/2016   Procedure: CATARACT EXTRACTION PHACO AND INTRAOCULAR LENS PLACEMENT (IOC);  Surgeon: Roz Anes, MD;  Location: [facility];  Service: Ophthalmology;  Laterality: Right;  CDE: 7.56   CATARACT EXTRACTION W/PHACO Left 01/14/2017   Procedure: CATARACT EXTRACTION PHACO AND INTRAOCULAR LENS PLACEMENT (IOC);  Surgeon: Roz Anes, MD;  Location: [facility];  Service: Ophthalmology;  Laterality: Left;  CDE: 6.28   CHOLECYSTECTOMY     COLONOSCOPY  2013   Dr. Donnel: mild left-sided diverticulosis, multiple biopsies with path unremarkable.    COLONOSCOPY WITH PROPOFOL  N/A 06/03/2019   pancolonic diverticulosis, normal TI. S/p biopsies. Benign.    ELBOW DEBRIDEMENT Right    THYROID  SURGERY     partial thyroidectomy age 22.    Prior to Admission medications   Medication Sig Start Date End Date Taking? Authorizing Provider  alendronate (FOSAMAX) 70 MG  tablet Take 70 mg by mouth every Wednesday. Take with a full glass of water on an empty stomach.   Yes [provider]  aspirin-acetaminophen-caffeine (EXCEDRIN MIGRAINE) 250-250-65 MG tablet Take by mouth every 6 (six) hours as needed for headache.   Yes [provider]  atenolol  (TENORMIN ) 25 MG tablet TAKE ONE TABLET BY MOUTH TWICE DAILY *dose changed start with NEXT fill 07/15/22  Yes Branch, Dorn FALCON, MD  cycloSPORINE (RESTASIS) 0.05 % ophthalmic emulsion Place 1 drop into both eyes daily.   Yes [provider]  denosumab  (PROLIA ) 60 MG/ML SOSY injection Inject 60 mg into the skin every 6 (six) months.   Yes [provider]  diphenhydramine-acetaminophen (TYLENOL PM) 25-500 MG TABS tablet Take 1 tablet by mouth at bedtime.   Yes [provider]  estradiol (ESTRACE) 0.5 MG tablet Take 0.5 mg by mouth at bedtime.    Yes [provider]  folic acid (FOLVITE) 1 MG tablet Take 1 mg by mouth daily. 12/25/21  Yes [provider]  HYDROcodone-acetaminophen (NORCO/VICODIN) 5-325 MG tablet Take 1 tablet by mouth every 6 (six) hours as needed for moderate pain.   Yes [provider]  levothyroxine (SYNTHROID) 88 MCG tablet Take 88 mcg by mouth daily before breakfast.   Yes [provider]  linaclotide  (LINZESS ) 145 MCG CAPS capsule Take 1 capsule (145 mcg total) by mouth daily before  breakfast. 07/09/23  Yes Shirlean Therisa ORN, NP  midodrine  (PROAMATINE ) 2.5 MG tablet TAKE 1 TABLET (2.5 MG TOTAL) BY MOUTH 2 (TWO) TIMES DAILY WITH A MEAL. 06/19/23  Yes BranchDorn FALCON, MD  omeprazole (PRILOSEC) 20 MG capsule Take 20 mg by mouth daily.   Yes [provider]  predniSONE (DELTASONE) 5 MG tablet Take 4 mg by mouth daily.   Yes [provider]  Probiotic Product (PROBIOTIC DAILY PO) Take by mouth daily.   Yes [provider]  SUMAtriptan (IMITREX) 100 MG tablet Take 100 mg by mouth 2 (two) times daily as  needed. 01/17/20  Yes [provider]  venlafaxine (EFFEXOR) 75 MG tablet Take 75 mg by mouth daily.   Yes [provider]  latanoprost (XALATAN) 0.005 % ophthalmic solution Place 1 drop into both eyes at bedtime.    [provider]    Allergies as of 06/25/2023 - Review Complete 06/25/2023  Allergen Reaction Noted   Iodinated contrast media Nausea And Vomiting and Other (See Comments) 03/15/2013   Aspirin Other (See Comments) 03/15/2013   Codeine Nausea And Vomiting 03/15/2013   Morphine and codeine Nausea And Vomiting 03/15/2013   Sulfa antibiotics Nausea And Vomiting and Nausea Only 03/15/2013   Benadryl [diphenhydramine] Other (See Comments) 01/04/2020   Morphine  03/14/2015   Nsaids  05/10/2020    Family History  Problem Relation Age of Onset   Cancer Mother    Stroke Father    Addison's disease Sister    Alzheimer's disease Brother    Colon cancer Niece        deceased at 13     Social History   Socioeconomic History   Marital status: Widowed    Spouse name: Not on file   Number of children: Not on file   Years of education: Not on file   Highest education level: Not on file  Occupational History   Occupation: retired    Comment: [employer], cook  Tobacco Use   Smoking status: Former    Current packs/day: 0.00    Types: Cigarettes    Quit date: 12/08/1989    Years since quitting: 33.7    Passive exposure: Never   Smokeless tobacco: Never  Vaping Use   Vaping status: Never Used  Substance and Sexual Activity   Alcohol  use: No   Drug use: No   Sexual activity: Never  Other Topics Concern   Not on file  Social History Narrative   Lives alone.   Social Drivers of Corporate investment banker Strain: Not on file  Food Insecurity: Not on file  Transportation Needs: Not on file  Physical Activity: Not on file  Stress: Not on file  Social Connections: Not on file  Intimate Partner Violence: Not on file    Review of  Systems: See HPI, otherwise negative ROS  Physical Exam: BP (!) 153/57   Pulse (!) 57   Temp 97.7 F (36.5 C) (Oral)   Resp 16   Ht 5' 4 (1.626 m)   Wt 50.9 kg   SpO2 100%   BMI 19.26 kg/m  General:   Alert,  Well-developed, well-nourished, pleasant and cooperative in NAD Neck:  Supple; no masses or thyromegaly. No significant cervical adenopathy. Lungs:  Clear throughout to auscultation.   No wheezes, crackles, or rhonchi. No acute distress. Heart:  Regular rate and rhythm; no murmurs, clicks, rubs,  or gallops. Abdomen: Non-distended, normal bowel sounds.  Soft and nontender without appreciable  mass or hepatosplenomegaly.  Pulses:  Normal pulses noted. Extremities:  Without clubbing or edema.  Impression/Plan:    Patient with rectal bleeding and weight loss.  EGD and colonoscopy today.  She denies dysphagia.  The risks, benefits, limitations, imponderables and alternatives regarding both EGD and colonoscopy have been reviewed with the patient. Questions have been answered. All parties agreeable.       Notice: This dictation was prepared with Dragon dictation along with smaller phrase technology. Any transcriptional errors that result from this process are unintentional and may not be corrected upon review.

## 2023-08-25 NOTE — Discharge Instructions (Signed)
 EGD Discharge instructions Please read the instructions outlined below and refer to this sheet in the next few weeks. These discharge instructions provide you with general information on caring for yourself after you leave the hospital. Your doctor may also give you specific instructions. While your treatment has been planned according to the most current medical practices available, unavoidable complications occasionally occur. If you have any problems or questions after discharge, please call your doctor. ACTIVITY You may resume your regular activity but move at a slower pace for the next 24 hours.  Take frequent rest periods for the next 24 hours.  Walking will help expel (get rid of) the air and reduce the bloated feeling in your abdomen.  No driving for 24 hours (because of the anesthesia (medicine) used during the test).  You may shower.  Do not sign any important legal documents or operate any machinery for 24 hours (because of the anesthesia used during the test).  NUTRITION Drink plenty of fluids.  You may resume your normal diet.  Begin with a light meal and progress to your normal diet.  Avoid alcoholic beverages for 24 hours or as instructed by your caregiver.  MEDICATIONS You may resume your normal medications unless your caregiver tells you otherwise.  WHAT YOU CAN EXPECT TODAY You may experience abdominal discomfort such as a feeling of fullness or "gas" pains.  FOLLOW-UP Your doctor will discuss the results of your test with you.  SEEK IMMEDIATE MEDICAL ATTENTION IF ANY OF THE FOLLOWING OCCUR: Excessive nausea (feeling sick to your stomach) and/or vomiting.  Severe abdominal pain and distention (swelling).  Trouble swallowing.  Temperature over 101 F (37.8 C).  Rectal bleeding or vomiting of blood.    Colonoscopy Discharge Instructions  Read the instructions outlined below and refer to this sheet in the next few weeks. These discharge instructions provide you with  general information on caring for yourself after you leave the hospital. Your doctor may also give you specific instructions. While your treatment has been planned according to the most current medical practices available, unavoidable complications occasionally occur. If you have any problems or questions after discharge, call Dr. Shaaron at [phone]. ACTIVITY You may resume your regular activity, but move at a slower pace for the next 24 hours.  Take frequent rest periods for the next 24 hours.  Walking will help get rid of the air and reduce the bloated feeling in your belly (abdomen).  No driving for 24 hours (because of the medicine (anesthesia) used during the test).   Do not sign any important legal documents or operate any machinery for 24 hours (because of the anesthesia used during the test).  NUTRITION Drink plenty of fluids.  You may resume your normal diet as instructed by your doctor.  Begin with a light meal and progress to your normal diet. Heavy or fried foods are harder to digest and may make you feel sick to your stomach (nauseated).  Avoid alcoholic beverages for 24 hours or as instructed.  MEDICATIONS You may resume your normal medications unless your doctor tells you otherwise.  WHAT YOU CAN EXPECT TODAY Some feelings of bloating in the abdomen.  Passage of more gas than usual.  Spotting of blood in your stool or on the toilet paper.  IF YOU HAD POLYPS REMOVED DURING THE COLONOSCOPY: No aspirin products for 7 days or as instructed.  No alcohol  for 7 days or as instructed.  Eat a soft diet for the next 24 hours.  FINDING  OUT THE RESULTS OF YOUR TEST Not all test results are available during your visit. If your test results are not back during the visit, make an appointment with your caregiver to find out the results. Do not assume everything is normal if you have not heard from your caregiver or the medical facility. It is important for you to follow up on all of your test  results.  SEEK IMMEDIATE MEDICAL ATTENTION IF: You have more than a spotting of blood in your stool.  Your belly is swollen (abdominal distention).  You are nauseated or vomiting.  You have a temperature over 101.  You have abdominal pain or discomfort that is severe or gets worse throughout the day.      your upper GI tract appeared normal  You have diverticulosis in your colon.  You have hemorrhoids-the likely source of bleeding  a future colonoscopy is not recommended.    Pamphlet on hemorrhoid banding provided   office visit with Therisa Stager in 6 weeks for possible hemorrhoid banding   at patient request, called [facility] at [phone]-rjoo rolled to voicemail.  I left a detailed message.

## 2023-08-25 NOTE — Op Note (Signed)
 [facility] Patient Name: Tina Schultz Procedure Date: 08/25/2023 11:42 AM MRN: [id number] Date of Birth: [birth year] Attending MD: Lamar Ozell Hollingshead , MD, [id number] CSN: [id number] Age: 78 Admit Type: Outpatient Procedure:                Upper GI endoscopy Indications:              Nausea, Weight loss Providers:                Lamar Ozell Hollingshead, MD, Madelin Hunter, RN, Dorcas Lenis, Technician Referring MD:              Medicines:                Propofol  per Anesthesia Complications:            No immediate complications. Estimated Blood Loss:     Estimated blood loss: none. Procedure:                Pre-Anesthesia Assessment:                           - Prior to the procedure, a History and Physical                            was performed, and patient medications and                            allergies were reviewed. The patient's tolerance of                            previous anesthesia was also reviewed. The risks                            and benefits of the procedure and the sedation                            options and risks were discussed with the patient.                            All questions were answered, and informed consent                            was obtained. Prior Anticoagulants: The patient has                            taken no anticoagulant or antiplatelet agents. ASA                            Grade Assessment: III - A patient with severe                            systemic disease. After reviewing the risks and  benefits, the patient was deemed in satisfactory                            condition to undergo the procedure.                           After obtaining informed consent, the endoscope was                            passed under direct vision. Throughout the                            procedure, the patient's blood pressure, pulse, and                            oxygen  saturations were monitored continuously. The                            [id number] ([id number]) scope was introduced through the                            mouth, and advanced to the second part of duodenum.                            The upper GI endoscopy was accomplished without                            difficulty. The patient tolerated the procedure                            well. Scope In: 12:17:51 PM Scope Out: 12:21:19 PM Total Procedure Duration: 0 hours 3 minutes 28 seconds  Findings:      The examined esophagus was normal.      The entire examined stomach was normal.      The duodenal bulb and second portion of the duodenum were normal. Impression:               - Normal esophagus.                           - Normal stomach.                           - Normal duodenal bulb and second portion of the                            duodenum.                           - No specimens collected. Moderate Sedation:      Moderate (conscious) sedation was personally administered by an       anesthesia professional. The following parameters were monitored: oxygen       saturation, heart rate, blood pressure, respiratory rate, EKG, adequacy       of pulmonary ventilation, and response to care. Recommendation:           -  Patient has a contact number available for                            emergencies. The signs and symptoms of potential                            delayed complications were discussed with the                            patient. Return to normal activities tomorrow.                            Written discharge instructions were provided to the                            patient.                           - Advance diet as tolerated. Continue present                            medications. Office visit with us  in 6 weeks. See                            colonoscopy report. Procedure Code(s):        --- Professional ---                           [phone],  Esophagogastroduodenoscopy, flexible,                            transoral; diagnostic, including collection of                            specimen(s) by brushing or washing, when performed                            (separate procedure) Diagnosis Code(s):        --- Professional ---                           R11.0, Nausea                           R63.4, Abnormal weight loss CPT copyright 2022 American Medical Association. All rights reserved. The codes documented in this report are preliminary and upon coder review may  be revised to meet current compliance requirements. Lamar HERO. Askari Kinley, MD Lamar Ozell Hollingshead, MD 08/25/2023 12:48:01 PM This report has been signed electronically. Number of Addenda: 0

## 2023-08-25 NOTE — Op Note (Signed)
 [facility] Patient Name: Tina Schultz Procedure Date: 08/25/2023 11:39 AM MRN: [id number] Date of Birth: Jun 18, 1945 Attending MD: Lamar Ozell Hollingshead , MD, [id number] CSN: [id number] Age: 78 Admit Type: Outpatient Procedure:                Colonoscopy Indications:              Hematochezia Providers:                Lamar Ozell Hollingshead, MD, Madelin Hunter, RN, Dorcas Lenis, Technician Referring MD:              Medicines:                Propofol  per Anesthesia Complications:            No immediate complications. Estimated Blood Loss:     Estimated blood loss: none. Procedure:                Pre-Anesthesia Assessment:                           - Prior to the procedure, a History and Physical                            was performed, and patient medications and                            allergies were reviewed. The patient's tolerance of                            previous anesthesia was also reviewed. The risks                            and benefits of the procedure and the sedation                            options and risks were discussed with the patient.                            All questions were answered, and informed consent                            was obtained. Prior Anticoagulants: The patient has                            taken no anticoagulant or antiplatelet agents. ASA                            Grade Assessment: III - A patient with severe                            systemic disease. After reviewing the risks and  benefits, the patient was deemed in satisfactory                            condition to undergo the procedure.                           After obtaining informed consent, the colonoscope                            was passed under direct vision. Throughout the                            procedure, the patient's blood pressure, pulse, and                            oxygen saturations were  monitored continuously. The                            [id number]) scope was introduced through the                            anus and advanced to the 5 cm into the ileum. The                            colonoscopy was performed without difficulty. The                            patient tolerated the procedure well. The quality                            of the bowel preparation was adequate. The terminal                            ileum, ileocecal valve, appendiceal orifice, and                            rectum were photographed. The entire colon was well                            visualized. Scope In: 12:26:08 PM Scope Out: 12:44:53 PM Scope Withdrawal Time: 0 hours 10 minutes 17 seconds  Total Procedure Duration: 0 hours 18 minutes 45 seconds  Findings:      Hemorrhoids were found on perianal exam.      Non-bleeding external and internal hemorrhoids were found during       retroflexion. The hemorrhoids were Grade III (internal hemorrhoids that       prolapse but require manual reduction).      Many large-mouthed and medium-mouthed diverticula were found in the       entire colon. Normal-appearing distal 5 cm's of TI.      The exam was otherwise without abnormality on direct and retroflexion       views. Impression:               - Hemorrhoids found on perianal exam.                           -  Non-bleeding external and internal hemorrhoids.                           - Diverticulosis in the entire examined colon.                           - The examination was otherwise normal on direct                            and retroflexion views. Normal TI                           - No specimens collected. Patient likely bleeding                            from hemorrhoids. Would be a reasonably good                            hemorrhoid banding candidate Moderate Sedation:      Moderate (conscious) sedation was personally administered by an       anesthesia professional. The  following parameters were monitored: oxygen       saturation, heart rate, blood pressure, respiratory rate, EKG, adequacy       of pulmonary ventilation, and response to care. Recommendation:           - Patient has a contact number available for                            emergencies. The signs and symptoms of potential                            delayed complications were discussed with the                            patient. Return to normal activities tomorrow.                            Written discharge instructions were provided to the                            patient.                           - Advance diet as tolerated.                           - Continue present medications.                           - No repeat colonoscopy.                           - Return to GI clinic in 6 weeks. See EGD report.                            Pamphlet on hemorrhoid banding provided.  Procedure Code(s):        --- Professional ---                           [phone], Colonoscopy, flexible; diagnostic, including                            collection of specimen(s) by brushing or washing,                            when performed (separate procedure) Diagnosis Code(s):        --- Professional ---                           K64.2, Third degree hemorrhoids                           K92.1, Melena (includes Hematochezia)                           K57.30, Diverticulosis of large intestine without                            perforation or abscess without bleeding CPT copyright 2022 American Medical Association. All rights reserved. The codes documented in this report are preliminary and upon coder review may  be revised to meet current compliance requirements. Lamar HERO. Aydrian Halpin, MD Lamar Ozell Hollingshead, MD 08/25/2023 12:54:39 PM This report has been signed electronically. Number of Addenda: 0

## 2023-08-25 NOTE — Transfer of Care (Addendum)
 Immediate Anesthesia Transfer of Care Note  Patient: Tina Schultz  Procedure(s) Performed: COLONOSCOPY EGD (ESOPHAGOGASTRODUODENOSCOPY)  Patient Location: Short Stay  Anesthesia Type:General  Level of Consciousness: drowsy and patient cooperative  Airway & Oxygen Therapy: Patient Spontanous Breathing  Post-op Assessment: Report given to RN and Post -op Vital signs reviewed and stable  Post vital signs: Reviewed and stable  Last Vitals:  Vitals Value Taken Time  BP 146/46 08/25/23   1251  Temp 36.4 08/25/23   1251  Pulse 66 08/25/23   1251  Resp 20 08/25/23   1251  SpO2 99% 08/25/23   1251    Last Pain:  Vitals:   08/25/23 1213  TempSrc:   PainSc: 0-No pain         Complications: No notable events documented.

## 2023-08-26 ENCOUNTER — Encounter (HOSPITAL_COMMUNITY): Payer: Self-pay | Admitting: Internal Medicine

## 2023-08-30 NOTE — Anesthesia Postprocedure Evaluation (Signed)
 Anesthesia Post Note  Patient: Tina Schultz  Procedure(s) Performed: COLONOSCOPY EGD (ESOPHAGOGASTRODUODENOSCOPY)  Patient location during evaluation: Phase II Anesthesia Type: General Level of consciousness: awake Pain management: pain level controlled Vital Signs Assessment: post-procedure vital signs reviewed and stable Respiratory status: spontaneous breathing and respiratory function stable Cardiovascular status: blood pressure returned to baseline and stable Postop Assessment: no headache and no apparent nausea or vomiting Anesthetic complications: no Comments: Late entry   No notable events documented.   Last Vitals:  Vitals:   08/25/23 1116 08/25/23 1251  BP: (!) 153/57 (!) 146/46  Pulse:    Resp:  20  Temp:  36.4 C  SpO2:  99%    Last Pain:  Vitals:   08/26/23 1447  TempSrc:   PainSc: 0-No pain                 [name]

## 2023-09-12 ENCOUNTER — Emergency Department (HOSPITAL_COMMUNITY)
Admission: EM | Admit: 2023-09-12 | Discharge: 2023-09-12 | Disposition: A | Discharge status: Home / Self Care | Source: Ambulatory Visit | Attending: Emergency Medicine | Admitting: Emergency Medicine

## 2023-09-12 ENCOUNTER — Emergency Department (HOSPITAL_COMMUNITY)

## 2023-09-12 ENCOUNTER — Encounter (HOSPITAL_COMMUNITY): Payer: Self-pay

## 2023-09-12 ENCOUNTER — Other Ambulatory Visit: Payer: Self-pay

## 2023-09-12 DIAGNOSIS — R2689 Other abnormalities of gait and mobility: Secondary | ICD-10-CM | POA: Insufficient documentation

## 2023-09-12 DIAGNOSIS — R200 Anesthesia of skin: Secondary | ICD-10-CM | POA: Insufficient documentation

## 2023-09-12 DIAGNOSIS — I6522 Occlusion and stenosis of left carotid artery: Secondary | ICD-10-CM | POA: Insufficient documentation

## 2023-09-12 DIAGNOSIS — Z7982 Long term (current) use of aspirin: Secondary | ICD-10-CM | POA: Insufficient documentation

## 2023-09-12 DIAGNOSIS — R531 Weakness: Secondary | ICD-10-CM | POA: Diagnosis not present

## 2023-09-12 DIAGNOSIS — R519 Headache, unspecified: Secondary | ICD-10-CM | POA: Diagnosis not present

## 2023-09-12 DIAGNOSIS — Z79899 Other long term (current) drug therapy: Secondary | ICD-10-CM | POA: Diagnosis not present

## 2023-09-12 DIAGNOSIS — R110 Nausea: Secondary | ICD-10-CM | POA: Diagnosis present

## 2023-09-12 DIAGNOSIS — R4182 Altered mental status, unspecified: Secondary | ICD-10-CM | POA: Diagnosis present

## 2023-09-12 DIAGNOSIS — R202 Paresthesia of skin: Secondary | ICD-10-CM | POA: Diagnosis not present

## 2023-09-12 DIAGNOSIS — R9089 Other abnormal findings on diagnostic imaging of central nervous system: Secondary | ICD-10-CM | POA: Diagnosis not present

## 2023-09-12 LAB — CBC
HCT: 33.3 % — ABNORMAL LOW (ref 36.0–46.0)
Hemoglobin: 11.3 g/dL — ABNORMAL LOW (ref 12.0–15.0)
MCH: 31.5 pg (ref 26.0–34.0)
MCHC: 33.9 g/dL (ref 30.0–36.0)
MCV: 92.8 fL (ref 80.0–100.0)
Platelets: 232 K/uL (ref 150–400)
RBC: 3.59 MIL/uL — ABNORMAL LOW (ref 3.87–5.11)
RDW: 11.9 % (ref 11.5–15.5)
WBC: 8.3 K/uL (ref 4.0–10.5)
nRBC: 0 % (ref 0.0–0.2)

## 2023-09-12 LAB — COMPREHENSIVE METABOLIC PANEL WITH GFR
ALT: 14 U/L (ref 0–44)
AST: 23 U/L (ref 15–41)
Albumin: 3.8 g/dL (ref 3.5–5.0)
Alkaline Phosphatase: 35 U/L — ABNORMAL LOW (ref 38–126)
Anion gap: 11 (ref 5–15)
BUN: 14 mg/dL (ref 8–23)
CO2: 24 mmol/L (ref 22–32)
Calcium: 9.2 mg/dL (ref 8.9–10.3)
Chloride: 104 mmol/L (ref 98–111)
Creatinine, Ser: 0.87 mg/dL (ref 0.44–1.00)
GFR, Estimated: >60 mL/min (ref >60)
Glucose, Bld: 103 mg/dL — ABNORMAL HIGH (ref 70–99)
Potassium: 4.1 mmol/L (ref 3.5–5.1)
Sodium: 139 mmol/L (ref 135–145)
Total Bilirubin: 0.7 mg/dL (ref 0.0–1.2)
Total Protein: 6.3 g/dL — ABNORMAL LOW (ref 6.5–8.1)

## 2023-09-12 LAB — DIFFERENTIAL
Abs Granulocyte: 5.2 K/uL (ref 1.5–6.5)
Abs Immature Granulocytes: 0.02 K/uL (ref 0.00–0.07)
Basophils Absolute: 0.1 K/uL (ref 0.0–0.1)
Basophils Relative: 1 %
Eosinophils Absolute: 0.1 K/uL (ref 0.0–0.5)
Eosinophils Relative: 1 %
Immature Granulocytes: 0 %
Lymphocytes Relative: 29 %
Lymphs Abs: 2.4 K/uL (ref 0.7–4.0)
Monocytes Absolute: 0.5 K/uL (ref 0.1–1.0)
Monocytes Relative: 6 %
Neutro Abs: 5.2 K/uL (ref 1.7–7.7)
Neutrophils Relative %: 63 %

## 2023-09-12 LAB — URINALYSIS, ROUTINE W REFLEX MICROSCOPIC
Bilirubin Urine: NEGATIVE
Glucose, UA: NEGATIVE mg/dL
Hgb urine dipstick: NEGATIVE
Ketones, ur: NEGATIVE mg/dL
Leukocytes,Ua: NEGATIVE
Nitrite: NEGATIVE
Protein, ur: NEGATIVE mg/dL
Specific Gravity, Urine: 1.012 (ref 1.005–1.030)
pH: 6 (ref 5.0–8.0)

## 2023-09-12 LAB — PROTIME-INR
INR: 1 (ref 0.8–1.2)
Prothrombin Time: 14 s (ref 11.4–15.2)

## 2023-09-12 LAB — CBG MONITORING, ED: Glucose-Capillary: 106 mg/dL — ABNORMAL HIGH (ref 70–99)

## 2023-09-12 LAB — APTT: aPTT: 27 s (ref 24–36)

## 2023-09-12 LAB — RAPID URINE DRUG SCREEN, HOSP PERFORMED
Amphetamines: NOT DETECTED
Barbiturates: NOT DETECTED
Benzodiazepines: NOT DETECTED
Cocaine: NOT DETECTED
Opiates: NOT DETECTED
Tetrahydrocannabinol: NOT DETECTED

## 2023-09-12 LAB — ETHANOL: Alcohol, Ethyl (B): <15 mg/dL (ref <15)

## 2023-09-12 MED ORDER — ACETAMINOPHEN 325 MG PO TABS
650.0000 mg | ORAL_TABLET | Freq: Once | ORAL | Status: AC
  Administered 2023-09-12: 650 mg via ORAL
  Filled 2023-09-12: qty 2

## 2023-09-12 MED ORDER — GADOBUTROL 1 MMOL/ML IV SOLN
5.0000 mL | Freq: Once | INTRAVENOUS | Status: AC | PRN
  Administered 2023-09-12: 5 mL via INTRAVENOUS

## 2023-09-12 MED ORDER — HYDROCODONE-ACETAMINOPHEN 5-325 MG PO TABS
1.0000 | ORAL_TABLET | Freq: Four times a day (QID) | ORAL | 0 refills | Status: AC | PRN
Start: 2023-09-12 — End: ?

## 2023-09-12 MED ORDER — ONDANSETRON HCL 4 MG/2ML IJ SOLN
4.0000 mg | Freq: Once | INTRAMUSCULAR | Status: AC
  Administered 2023-09-12: 4 mg via INTRAVENOUS
  Filled 2023-09-12: qty 2

## 2023-09-12 NOTE — ED Notes (Signed)
 Dr Suzette does not want a CODE STROKE ACTIVATED.

## 2023-09-12 NOTE — ED Notes (Signed)
CODE STROKE activated  

## 2023-09-12 NOTE — ED Triage Notes (Signed)
 Pt stated that she began having numbness and tingling on both sides of her face last night but is now only complaining of left facial numbness this morning. Pt also stated that she began feeling nauseated last night as well. Pt stated that she has balance issues already but they have gotten worse as well. Pt ambulatory without assistance to triage. No memory loss noted

## 2023-09-12 NOTE — ED Notes (Signed)
 Patient started having hives to right arm after zofran  given.  No breathing issues or oral swelling.  Declined benadryl.

## 2023-09-12 NOTE — Discharge Instructions (Signed)
 Follow up with his neurologist in the next 2 to 3 weeks for the weakness and headache

## 2023-09-13 NOTE — ED Provider Notes (Signed)
 [place] EMERGENCY DEPARTMENT AT [facility] Provider Note   CSN: [id number] Arrival date & time: 09/12/23  1126     Patient presents with: Numbness   Tina Schultz is a 78 y.o. female.   Patient complains of a headache and some mild weakness to her left arm and left leg.  This has been going on for couple days.  The history is provided by the patient and medical records. No language interpreter was used.  Headache Pain location:  Generalized Quality:  Dull Radiates to:  Does not radiate Severity currently:  7/10 Severity at highest:  8/10 Onset quality:  Sudden Timing:  Intermittent Progression:  Partially resolved Chronicity:  New Similar to prior headaches: no   Associated symptoms: no abdominal pain, no back pain, no congestion, no cough, no diarrhea, no fatigue, no seizures and no sinus pressure        Prior to Admission medications   Medication Sig Start Date End Date Taking? Authorizing Provider  HYDROcodone -acetaminophen  (NORCO/VICODIN) 5-325 MG tablet Take 1 tablet by mouth every 6 (six) hours as needed (headache not relieved by tylenol  alone). 09/12/23  Yes Madelon Welsch, MD  alendronate (FOSAMAX) 70 MG tablet Take 70 mg by mouth every Wednesday. Take with a full glass of water on an empty stomach.    [provider]  aspirin-acetaminophen -caffeine (EXCEDRIN MIGRAINE) 250-250-65 MG tablet Take by mouth every 6 (six) hours as needed for headache.    [provider]  atenolol  (TENORMIN ) 25 MG tablet TAKE ONE TABLET BY MOUTH TWICE DAILY *dose changed start with NEXT fill 07/15/22   Alvan Dorn FALCON, MD  busPIRone (BUSPAR) 15 MG tablet Take 15 mg by mouth 2 (two) times daily. 07/28/23   [provider]  cycloSPORINE (RESTASIS) 0.05 % ophthalmic emulsion Place 1 drop into both eyes daily.    [provider]  denosumab  (PROLIA ) 60 MG/ML SOSY injection Inject 60 mg into the skin every 6 (six) months.    [provider]  diphenhydramine-acetaminophen  (TYLENOL  PM) 25-500 MG TABS tablet Take 1 tablet by mouth at bedtime.    [provider]  estradiol (ESTRACE) 0.5 MG tablet Take 0.5 mg by mouth at bedtime.     [provider]  folic acid (FOLVITE) 1 MG tablet Take 1 mg by mouth daily. 12/25/21   [provider]  latanoprost (XALATAN) 0.005 % ophthalmic solution Place 1 drop into both eyes at bedtime.    [provider]  levothyroxine (SYNTHROID) 75 MCG tablet Take 75 mcg by mouth daily. 07/27/23   [provider]  linaclotide  (LINZESS ) 145 MCG CAPS capsule Take 1 capsule (145 mcg total) by mouth daily before breakfast. 07/09/23   Shirlean Therisa ORN, NP  midodrine  (PROAMATINE ) 2.5 MG tablet TAKE 1 TABLET (2.5 MG TOTAL) BY MOUTH 2 (TWO) TIMES DAILY WITH A MEAL. 06/19/23   Alvan Dorn FALCON, MD  omeprazole (PRILOSEC) 20 MG capsule Take 20 mg by mouth daily.    [provider]  predniSONE (DELTASONE) 1 MG tablet Take 4 mg by mouth daily. 09/06/23   [provider]  Probiotic Product (PROBIOTIC DAILY PO) Take by mouth daily.    [provider]  SUMAtriptan (IMITREX) 100 MG tablet Take 100 mg by mouth 2 (two) times daily as needed. 01/17/20   [provider]  traMADol -acetaminophen  (ULTRACET) 37.5-325 MG tablet Take 1 tablet by mouth every 6 (six) hours. 05/20/23   [provider]  venlafaxine (EFFEXOR) 75 MG tablet Take  75 mg by mouth daily.    [provider]    Allergies: Iodinated contrast media, Aspirin, Codeine, Morphine and codeine, Sulfa antibiotics, Benadryl [diphenhydramine], Morphine, Nsaids, and Zofran  [ondansetron ]    Review of Systems  Constitutional:  Negative for appetite change and fatigue.  HENT:  Negative for congestion, ear discharge and sinus pressure.   Eyes:  Negative for discharge.  Respiratory:  Negative for cough.   Cardiovascular:  Negative for chest pain.  Gastrointestinal:  Negative for abdominal  pain and diarrhea.  Genitourinary:  Negative for frequency and hematuria.  Musculoskeletal:  Negative for back pain.  Skin:  Negative for rash.  Neurological:  Positive for headaches. Negative for seizures.       Weakness in left arm left leg  Psychiatric/Behavioral:  Negative for hallucinations.     Updated Vital Signs BP (!) 161/60   Pulse (!) 58   Temp (!) 97.5 F (36.4 C) (Oral)   Resp 13   Ht 5' 4 (1.626 m) Comment: Simultaneous filing. User may not have seen previous data.  Wt 49.9 kg Comment: Simultaneous filing. User may not have seen previous data.  SpO2 98%   BMI 18.88 kg/m   Physical Exam Vitals and nursing note reviewed.  Constitutional:      Appearance: She is well-developed.  HENT:     Head: Normocephalic.     Nose: Nose normal.  Eyes:     General: No scleral icterus.    Conjunctiva/sclera: Conjunctivae normal.  Neck:     Thyroid : No thyromegaly.  Cardiovascular:     Rate and Rhythm: Normal rate and regular rhythm.     Heart sounds: No murmur heard.    No friction rub. No gallop.  Pulmonary:     Breath sounds: No stridor. No wheezing or rales.  Chest:     Chest wall: No tenderness.  Abdominal:     General: There is no distension.     Tenderness: There is no abdominal tenderness. There is no rebound.  Musculoskeletal:        General: Normal range of motion.     Cervical back: Neck supple.     Comments: Minimal left arm weakness  Lymphadenopathy:     Cervical: No cervical adenopathy.  Skin:    Findings: No erythema or rash.  Neurological:     Mental Status: She is oriented to person, place, and time.     Motor: No abnormal muscle tone.     Coordination: Coordination normal.  Psychiatric:        Behavior: Behavior normal.     (all labs ordered are listed, but only abnormal results are displayed) Labs Reviewed  COMPREHENSIVE METABOLIC PANEL WITH GFR - Abnormal; Notable for the following components:      Result Value   Glucose, Bld 103 (*)     Total Protein 6.3 (*)    Alkaline Phosphatase 35 (*)    All other components within normal limits  CBC - Abnormal; Notable for the following components:   RBC 3.59 (*)    Hemoglobin 11.3 (*)    HCT 33.3 (*)    All other components within normal limits  URINALYSIS, ROUTINE W REFLEX MICROSCOPIC - Abnormal; Notable for the following components:   APPearance HAZY (*)    All other components within normal limits  CBG MONITORING, ED - Abnormal; Notable for the following components:   Glucose-Capillary 106 (*)    All other components within normal limits  ETHANOL  PROTIME-INR  APTT  DIFFERENTIAL  RAPID URINE DRUG SCREEN, HOSP PERFORMED    EKG: None  Radiology: MR BRAIN WO CONTRAST Result Date: 09/12/2023 EXAM: MRI BRAIN WITHOUT CONTRAST MRA HEAD WITHOUT CONTRAST MRA NECK WITHOUT AND WITH CONTRAST 09/12/2023 02:53:56 PM TECHNIQUE: Multiplanar multisequence MRI of the brain was performed without the administration of intravenous contrast. MRA of the head was performed without contrast using time-of-flight technique. MRA of the neck was performed without and with contrast. 3D postprocessing with multiplanar reformations and MIPs was performed for better evaluation of the vasculature. Stenosis of the cervical internal carotid arteries is measured using NASCET criteria. COMPARISON: Head CT 09/12/2023 CLINICAL HISTORY: Mental status change, unknown cause. Pt stated that she began having numbness and tingling on both sides of her face last night but is now only complaining of left facial numbness this morning. Pt also stated that she began feeling nauseated last night as well. Pt stated that she has balance issues already but they have gotten worse as well. Pt ambulatory without assistance to triage. No memory loss noted. FINDINGS: MRI BRAIN: BRAIN AND VENTRICLES: Few scattered foci of T2 hyperintensity in the cerebral white matter, most consistent with mild chronic small vessel disease and within  expected range for age. Age appropriate cerebral volume. No acute infarct. No acute intracranial hemorrhage. No mass or abnormal enhancement. No midline shift. No hydrocephalus. The sella is unremarkable. Normal flow voids. ORBITS: No acute abnormality. SINUSES AND MASTOIDS: No acute abnormality. BONES AND SOFT TISSUES: Normal bone marrow signal. No acute soft tissue abnormality. MRA HEAD: ANTERIOR CIRCULATION: No significant stenosis of the internal carotid arteries. No significant stenosis of the anterior cerebral arteries. No significant stenosis of the middle cerebral arteries. No aneurysm. POSTERIOR CIRCULATION: No significant stenosis of the posterior cerebral arteries. No significant stenosis of the basilar artery. No significant stenosis of the vertebral arteries. No aneurysm. MRA NECK: CERVICAL CAROTID ARTERIES: Approximately 45% stenosis of the proximal left cervical ICA. The right cervical ICA is patent without stenosis or aneurysm. No evidence of dissection. CERVICAL VERTEBRAL ARTERIES: No dissection. No significant stenosis. VASCULATURE: Two-vessel arch configuration with common origin of the left common carotid and right brachiocephalic arteries. IMPRESSION: 1. No acute intracranial abnormality. 2. 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 No evidence of dissection or large vessel occlusion. Electronically signed by: Ryan Chess MD 09/12/2023 03:11 PM EDT RP Workstation: [id number]   MR ANGIO HEAD WO CONTRAST Result Date: 09/12/2023 EXAM: MRI BRAIN WITHOUT CONTRAST MRA HEAD WITHOUT CONTRAST MRA NECK WITHOUT AND WITH CONTRAST 09/12/2023 02:53:56 PM TECHNIQUE: Multiplanar multisequence MRI of the brain was performed without the administration of intravenous contrast. MRA of the head was performed without contrast using time-of-flight technique. MRA of the neck was performed without and with contrast. 3D postprocessing with multiplanar reformations and MIPs was performed for  better evaluation of the vasculature. Stenosis of the cervical internal carotid arteries is measured using NASCET criteria. COMPARISON: Head CT 09/12/2023 CLINICAL HISTORY: Mental status change, unknown cause. Pt stated that she began having numbness and tingling on both sides of her face last night but is now only complaining of left facial numbness this morning. Pt also stated that she began feeling nauseated last night as well. Pt stated that she has balance issues already but they have gotten worse as well. Pt ambulatory without assistance to triage. No memory loss noted. FINDINGS: MRI BRAIN: BRAIN AND VENTRICLES: Few scattered foci of T2 hyperintensity in the cerebral white matter, most consistent with mild chronic small vessel disease  and within expected range for age. Age appropriate cerebral volume. No acute infarct. No acute intracranial hemorrhage. No mass or abnormal enhancement. No midline shift. No hydrocephalus. The sella is unremarkable. Normal flow voids. ORBITS: No acute abnormality. SINUSES AND MASTOIDS: No acute abnormality. BONES AND SOFT TISSUES: Normal bone marrow signal. No acute soft tissue abnormality. MRA HEAD: ANTERIOR CIRCULATION: No significant stenosis of the internal carotid arteries. No significant stenosis of the anterior cerebral arteries. No significant stenosis of the middle cerebral arteries. No aneurysm. POSTERIOR CIRCULATION: No significant stenosis of the posterior cerebral arteries. No significant stenosis of the basilar artery. No significant stenosis of the vertebral arteries. No aneurysm. MRA NECK: CERVICAL CAROTID ARTERIES: Approximately 45% stenosis of the proximal left cervical ICA. The right cervical ICA is patent without stenosis or aneurysm. No evidence of dissection. CERVICAL VERTEBRAL ARTERIES: No dissection. No significant stenosis. VASCULATURE: Two-vessel arch configuration with common origin of the left common carotid and right brachiocephalic arteries.  IMPRESSION: 1. No acute intracranial abnormality. 2. 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 No evidence of dissection or large vessel occlusion. Electronically signed by: Ryan Chess MD 09/12/2023 03:11 PM EDT RP Workstation: [id number]   MR Angiogram Neck W or Wo Contrast Result Date: 09/12/2023 EXAM: MRI BRAIN WITHOUT CONTRAST MRA HEAD WITHOUT CONTRAST MRA NECK WITHOUT AND WITH CONTRAST 09/12/2023 02:53:56 PM TECHNIQUE: Multiplanar multisequence MRI of the brain was performed without the administration of intravenous contrast. MRA of the head was performed without contrast using time-of-flight technique. MRA of the neck was performed without and with contrast. 3D postprocessing with multiplanar reformations and MIPs was performed for better evaluation of the vasculature. Stenosis of the cervical internal carotid arteries is measured using NASCET criteria. COMPARISON: Head CT 09/12/2023 CLINICAL HISTORY: Mental status change, unknown cause. Pt stated that she began having numbness and tingling on both sides of her face last night but is now only complaining of left facial numbness this morning. Pt also stated that she began feeling nauseated last night as well. Pt stated that she has balance issues already but they have gotten worse as well. Pt ambulatory without assistance to triage. No memory loss noted. FINDINGS: MRI BRAIN: BRAIN AND VENTRICLES: Few scattered foci of T2 hyperintensity in the cerebral white matter, most consistent with mild chronic small vessel disease and within expected range for age. Age appropriate cerebral volume. No acute infarct. No acute intracranial hemorrhage. No mass or abnormal enhancement. No midline shift. No hydrocephalus. The sella is unremarkable. Normal flow voids. ORBITS: No acute abnormality. SINUSES AND MASTOIDS: No acute abnormality. BONES AND SOFT TISSUES: Normal bone marrow signal. No acute soft tissue abnormality. MRA HEAD: ANTERIOR  CIRCULATION: No significant stenosis of the internal carotid arteries. No significant stenosis of the anterior cerebral arteries. No significant stenosis of the middle cerebral arteries. No aneurysm. POSTERIOR CIRCULATION: No significant stenosis of the posterior cerebral arteries. No significant stenosis of the basilar artery. No significant stenosis of the vertebral arteries. No aneurysm. MRA NECK: CERVICAL CAROTID ARTERIES: Approximately 45% stenosis of the proximal left cervical ICA. The right cervical ICA is patent without stenosis or aneurysm. No evidence of dissection. CERVICAL VERTEBRAL ARTERIES: No dissection. No significant stenosis. VASCULATURE: Two-vessel arch configuration with common origin of the left common carotid and right brachiocephalic arteries. IMPRESSION: 1. No acute intracranial abnormality. 2. 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 No evidence of dissection or large vessel occlusion. Electronically signed by: Ryan Chess MD 09/12/2023 03:11 PM EDT RP Workstation: [id number]  CT HEAD CODE STROKE WO CONTRAST Result Date: 09/12/2023 EXAM: CT HEAD WITHOUT CONTRAST 09/12/2023 12:34 PM TECHNIQUE: CT of the head was performed without the administration of intravenous contrast. Automated exposure control, iterative reconstruction, and/or weight based adjustment of the mA/kV was utilized to reduce the radiation dose to as low as reasonably achievable. COMPARISON: None available. CLINICAL HISTORY: FINDINGS: BRAIN AND VENTRICLES: No acute hemorrhage. Gray-white differentiation is preserved. No hydrocephalus. No extra-axial collection. No mass effect or midline shift. Generalized volume loss within expected range for patient age. ORBITS: No acute abnormality. SINUSES: No acute abnormality. SOFT TISSUES AND SKULL: No acute soft tissue abnormality. No skull fracture. [place] stroke program early CT (aspect) score ----- Ganglionic (caudate, ic, Lentiform Nucleus, insula, M1-m3):  7 Supraganglionic (m4-m6): 3 Total: 10 IMPRESSION: 1. No acute intracranial abnormality. ASPECT score: 10. 2. Generalized volume loss within expected range for patient age. Code stroke results were communicated to Dr. Ayleen Mckinstry at 12.51 pm on 09/12/2023 by phone. Electronically signed by: Ryan Chess MD 09/12/2023 12:52 PM EDT RP Workstation: [id number]     Procedures   Medications Ordered in the ED  acetaminophen  (TYLENOL ) tablet 650 mg (650 mg Oral Given 09/12/23 1318)  ondansetron  (ZOFRAN ) injection 4 mg (4 mg Intravenous Given 09/12/23 1329)  gadobutrol  (GADAVIST ) 1 MMOL/ML injection 5 mL (5 mLs Intravenous Contrast Given 09/12/23 1447)                                    Medical Decision Making Amount and/or Complexity of Data Reviewed Labs: ordered. Radiology: ordered.  Risk OTC drugs. Prescription drug management.   Patient's symptoms of a headache improved with treatment.  Also her weakness in her arm improved.  MRI was negative.  Suspect migraine variant.  Patient sent home to follow-up with neurology     Final diagnoses:  Left sided numbness  Bad headache    ED Discharge Orders          Ordered    HYDROcodone -acetaminophen  (NORCO/VICODIN) 5-325 MG tablet  Every 6 hours PRN        09/12/23 1522    Ambulatory referral to Neurology       Comments: An appointment is requested in approximately: 2 weeks   09/12/23 1523               Suzette Pac, MD 09/13/23 1725

## 2023-10-01 ENCOUNTER — Telehealth: Payer: Self-pay | Admitting: *Deleted

## 2023-10-01 ENCOUNTER — Other Ambulatory Visit: Payer: Self-pay | Admitting: Gastroenterology

## 2023-10-01 ENCOUNTER — Ambulatory Visit: Admitting: Gastroenterology

## 2023-10-01 ENCOUNTER — Encounter: Payer: Self-pay | Admitting: Gastroenterology

## 2023-10-01 VITALS — BP 135/63 | HR 56 | Temp 97.1°F | Ht 64.0 in | Wt 113.4 lb

## 2023-10-01 DIAGNOSIS — Z8719 Personal history of other diseases of the digestive system: Secondary | ICD-10-CM | POA: Diagnosis not present

## 2023-10-01 DIAGNOSIS — R140 Abdominal distension (gaseous): Secondary | ICD-10-CM | POA: Diagnosis not present

## 2023-10-01 DIAGNOSIS — K5900 Constipation, unspecified: Secondary | ICD-10-CM

## 2023-10-01 DIAGNOSIS — R109 Unspecified abdominal pain: Secondary | ICD-10-CM | POA: Insufficient documentation

## 2023-10-01 DIAGNOSIS — R1030 Lower abdominal pain, unspecified: Secondary | ICD-10-CM

## 2023-10-01 DIAGNOSIS — R634 Abnormal weight loss: Secondary | ICD-10-CM

## 2023-10-01 MED ORDER — LUBIPROSTONE 8 MCG PO CAPS
8.0000 ug | ORAL_CAPSULE | Freq: Two times a day (BID) | ORAL | 3 refills | Status: DC
Start: 2023-10-01 — End: 2023-11-19

## 2023-10-01 NOTE — Patient Instructions (Signed)
 I would like for you to start taking Amitiza  twice a day WITH FOOD to avoid nausea. If for some reason this is not covered by insurance or requires you try Linzess , I have given samples of Linzess . Don't take Linzess  until we let you know. If you do need to start, you will take one capsule on an empty stomach 30 minutes before eating. This can cause loose stool for about 5 days but gradually gets better. If not, let us  know.  Please take blood work with you to your primary care (orders provided).  I have ordered a non-contrast CT.  We will see you in 2 months or sooner if needed!  I enjoyed seeing you again today! I value our relationship and want to provide genuine, compassionate, and quality care. You may receive a survey regarding your visit with me, and I welcome your feedback! Thanks so much for taking the time to complete this. I look forward to seeing you again.      Therisa MICAEL Stager, PhD, ANP-BC [facility]

## 2023-10-01 NOTE — Telephone Encounter (Signed)
 CT A/P per devoted no PA required  Scheduled for 10/03/23, arrival 2:30pm to start drinking oral contrast. Called pt and she stated she needed to call back in a little while to get appt.

## 2023-10-01 NOTE — Progress Notes (Signed)
 Gastroenterology Office Note     Primary Care Physician:  Toribio Jerel MATSU, MD  Primary Gastroenterologist: Dr. Shaaron    Chief Complaint   Chief Complaint  Patient presents with   Follow-up    Patient here today for a follow up on rectal bleeding. Patient says this has subsided and she is now having mid abdominal pain and nausea.     History of Present Illness   Tina Schultz is a 78 y.o. female presenting today with a history of constipation, abdominal pain, weight loss, hx of rectal bleeding, undergoing colonoscopy/EGD in interim from last visit. She self-reports a hx of UC, but this has not been verified on any colonoscopy in the past or recently. Hx of dementia but able to provide basic history. Grandaughter present with her today.    Weight is remaining stable since April; however, last year was in the 130s.   Continues to note constipation. Rectal bleeding has resolved. Continued lower abdominal pain, bloating. Linzess  145 mcg without improvement. Appetite waxes and wanes. Nausea intermittently. No fever or chills.     EGD July 2025: normal esophagus, normal stomach, normal duodenum  Colonoscopy July 2025: hemorrhoids, diverticula, normal appearing distal 5 cm TI, no specimens collected.   Colonoscopy 2021 with pancolonic diverticulosis, normal TI, benign colonic biopsies.     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   Depression    Diabetes mellitus without complication (HCC)    Dysrhythmia    Fibromyalgia    GERD (gastroesophageal reflux disease)    Glaucoma    Hypertension    Hypothyroidism    IBS (irritable bowel syndrome)    Polymyalgia rheumatica (HCC)    Rheumatic fever    Stroke (HCC) 1991  1993   left sided weakness; memorey loss   Thyroid  disease    Ulcerative colitis (HCC)    per patient diagnosed at age 23    Past Surgical  History:  Procedure Laterality Date   ABDOMINAL HYSTERECTOMY     BIOPSY  06/03/2019   Procedure: BIOPSY;  Surgeon: Shaaron Lamar HERO, MD;  [facility];  Service: Endoscopy;;   CATARACT EXTRACTION W/PHACO Right 10/15/2016   Procedure: CATARACT EXTRACTION PHACO AND INTRAOCULAR LENS PLACEMENT (IOC);  Surgeon: Roz Anes, MD;  Location: [facility];  Service: Ophthalmology;  Laterality: Right;  CDE: 7.56   CATARACT EXTRACTION W/PHACO Left 01/14/2017   Procedure: CATARACT EXTRACTION PHACO AND INTRAOCULAR LENS PLACEMENT (IOC);  Surgeon: Roz Anes, MD;  Location: [facility];  Service: Ophthalmology;  Laterality: Left;  CDE: 6.28   CHOLECYSTECTOMY     COLONOSCOPY  2013   Dr. Donnel: mild left-sided diverticulosis, multiple biopsies with path unremarkable.    COLONOSCOPY N/A 08/25/2023   Procedure: COLONOSCOPY;  Surgeon: Shaaron Lamar HERO, MD;  [facility];  Service: Endoscopy;  Laterality: N/A;  12:45pm, asa 3   COLONOSCOPY WITH PROPOFOL  N/A 06/03/2019   pancolonic diverticulosis, normal TI. S/p biopsies. Benign.    ELBOW DEBRIDEMENT Right    ESOPHAGOGASTRODUODENOSCOPY N/A 08/25/2023   Procedure: EGD (ESOPHAGOGASTRODUODENOSCOPY);  Surgeon: Shaaron Lamar HERO, MD;  [facility];  Service: Endoscopy;  Laterality: N/A;   THYROID  SURGERY     partial thyroidectomy age 43.    Current Outpatient Medications  Medication Sig Dispense Refill   alendronate (FOSAMAX) 70 MG tablet Take  70 mg by mouth every Wednesday. Take with a full glass of water on an empty stomach.     aspirin-acetaminophen -caffeine (EXCEDRIN MIGRAINE) 250-250-65 MG tablet Take by mouth every 6 (six) hours as needed for headache.     atenolol  (TENORMIN ) 25 MG tablet TAKE ONE TABLET BY MOUTH TWICE DAILY *dose changed start with NEXT fill 180 tablet 1   denosumab  (PROLIA ) 60 MG/ML SOSY injection Inject 60 mg into the skin every 6 (six) months.     diphenhydramine-acetaminophen  (TYLENOL  PM) 25-500 MG TABS tablet Take 1  tablet by mouth at bedtime.     estradiol (ESTRACE) 0.5 MG tablet Take 0.5 mg by mouth at bedtime.      folic acid (FOLVITE) 1 MG tablet Take 1 mg by mouth daily.     HYDROcodone -acetaminophen  (NORCO/VICODIN) 5-325 MG tablet Take 1 tablet by mouth every 6 (six) hours as needed (headache not relieved by tylenol  alone). 10 tablet 0   latanoprost (XALATAN) 0.005 % ophthalmic solution Place 1 drop into both eyes at bedtime.     levothyroxine (SYNTHROID) 75 MCG tablet Take 75 mcg by mouth daily.     linaclotide  (LINZESS ) 145 MCG CAPS capsule Take 1 capsule (145 mcg total) by mouth daily before breakfast. 30 capsule 5   lubiprostone  (AMITIZA ) 8 MCG capsule Take 1 capsule (8 mcg total) by mouth 2 (two) times daily with a meal. 60 capsule 3   midodrine  (PROAMATINE ) 2.5 MG tablet TAKE 1 TABLET (2.5 MG TOTAL) BY MOUTH 2 (TWO) TIMES DAILY WITH A MEAL. 180 tablet 3   omeprazole (PRILOSEC) 20 MG capsule Take 20 mg by mouth daily.     predniSONE (DELTASONE) 1 MG tablet Take 4 mg by mouth daily.     SUMAtriptan (IMITREX) 100 MG tablet Take 100 mg by mouth 2 (two) times daily as needed.     venlafaxine (EFFEXOR) 75 MG tablet Take 75 mg by mouth daily.     traMADol -acetaminophen  (ULTRACET) 37.5-325 MG tablet Take 1 tablet by mouth every 6 (six) hours. (Patient not taking: Reported on 10/01/2023)     No current facility-administered medications for this visit.   Facility-Administered Medications Ordered in Other Visits  Medication Dose Route Frequency Provider Last Rate Last Admin   ondansetron  (ZOFRAN ) 4 mg in sodium chloride  0.9 % 50 mL IVPB  4 mg Intravenous Q6H PRN Gillie Duncans, MD        Allergies as of 10/01/2023 - Review Complete 10/01/2023  Allergen Reaction Noted   Iodinated contrast media Nausea And Vomiting and Other (See Comments) 03/15/2013   Aspirin Other (See Comments) 03/15/2013   Codeine Nausea And Vomiting 03/15/2013   Morphine and codeine Nausea And Vomiting 03/15/2013   Sulfa  antibiotics Nausea And Vomiting and Nausea Only 03/15/2013   Benadryl [diphenhydramine] Other (See Comments) 01/04/2020   Morphine  03/14/2015   Nsaids  05/10/2020   Zofran  [ondansetron ] Hives 09/12/2023    Family History  Problem Relation Age of Onset   Cancer Mother    Stroke Father    Addison's disease Sister    Alzheimer's disease Brother    Colon cancer Niece        deceased at 44     Social History   Socioeconomic History   Marital status: Widowed    Spouse name: Not on file   Number of children: Not on file   Years of education: Not on file   Highest education level: Not on file  Occupational History   Occupation: retired  Comment: [employer], cook  Tobacco Use   Smoking status: Former    Current packs/day: 0.00    Types: Cigarettes    Quit date: 12/08/1989    Years since quitting: 33.8    Passive exposure: Never   Smokeless tobacco: Never  Vaping Use   Vaping status: Never Used  Substance and Sexual Activity   Alcohol  use: No   Drug use: No   Sexual activity: Never  Other Topics Concern   Not on file  Social History Narrative   Lives alone.   Social Drivers of Corporate investment banker Strain: Not on file  Food Insecurity: Not on file  Transportation Needs: Not on file  Physical Activity: Not on file  Stress: Not on file  Social Connections: Not on file  Intimate Partner Violence: Not on file     Review of Systems  See HPI   Physical Exam   BP 135/63 (BP Location: Left Arm, Patient Position: Sitting, Cuff Size: Normal)   Pulse (!) 56   Temp (!) 97.1 F (36.2 C) (Temporal)   Ht 5' 4 (1.626 m)   Wt 113 lb 6.4 oz (51.4 kg)   BMI 19.47 kg/m  General:   Alert and oriented. Pleasant and cooperative. Well-nourished and well-developed.  Head:  Normocephalic and atraumatic. Eyes:  Without icterus Abdomen:  +BS, soft, TTP lower abdomen and non-distended. No HSM noted. No guarding or rebound. No masses appreciated.  Rectal:   Deferred  Msk:  Symmetrical without gross deformities. Normal posture. Extremities:  Without edema. Neurologic:  Alert and  oriented x4;  grossly normal neurologically. Skin:  Intact without significant lesions or rashes. Psych:  Alert and cooperative. Normal mood and affect.   Assessment   DAMIYAH DITMARS is a 78 y.o. female presenting today with a history of constipation, abdominal pain, weight loss, hx of rectal bleeding, undergoing colonoscopy/EGD in interim from last visit as noted above. Continues to note abdominal pain and constipation  Suspect lower abdominal pain is r/t underlying constipation. Notably, she self-reports hx of UC, but no colonoscopy has documented this. Discussed Linzess  and Amitiza  dosing and potential side effects. She would like to start Amitiza  first, and if not covered or needs to do Linzess , I have provided samples.  Will also update labs including TSH, celiac, iron panel   CT Planned in near future as well without contrast due to significant contrast allergy. With history of pancolonic diverticulosis, could also have a smoldering diverticulitis in setting of constipation.    PLAN    CT abd/pelvis  Labs with PCP in near future: orders provided Amitiza  8 mcg po BID: if insurance does not cover, start Linzess  72 mcg and samples provided Return in 2 months or sooner if needed   Therisa MICAEL Stager, PhD, ANP-BC [facility] Gastroenterology

## 2023-10-02 NOTE — Telephone Encounter (Signed)
 Spoke with pt and she is aware of CT app details.

## 2023-10-03 ENCOUNTER — Ambulatory Visit (HOSPITAL_COMMUNITY)
Admission: RE | Admit: 2023-10-03 | Discharge: 2023-10-03 | Disposition: A | Discharge status: Home / Self Care | Source: Ambulatory Visit | Attending: Gastroenterology | Admitting: Gastroenterology

## 2023-10-03 DIAGNOSIS — E782 Mixed hyperlipidemia: Secondary | ICD-10-CM | POA: Diagnosis not present

## 2023-10-03 DIAGNOSIS — E1165 Type 2 diabetes mellitus with hyperglycemia: Secondary | ICD-10-CM | POA: Diagnosis not present

## 2023-10-03 DIAGNOSIS — K5900 Constipation, unspecified: Secondary | ICD-10-CM | POA: Diagnosis not present

## 2023-10-03 DIAGNOSIS — F331 Major depressive disorder, recurrent, moderate: Secondary | ICD-10-CM | POA: Diagnosis not present

## 2023-10-03 DIAGNOSIS — R1030 Lower abdominal pain, unspecified: Secondary | ICD-10-CM | POA: Diagnosis not present

## 2023-10-03 DIAGNOSIS — R634 Abnormal weight loss: Secondary | ICD-10-CM | POA: Insufficient documentation

## 2023-10-03 DIAGNOSIS — E039 Hypothyroidism, unspecified: Secondary | ICD-10-CM | POA: Diagnosis not present

## 2023-10-03 DIAGNOSIS — K219 Gastro-esophageal reflux disease without esophagitis: Secondary | ICD-10-CM | POA: Diagnosis not present

## 2023-10-22 NOTE — Telephone Encounter (Signed)
 Pt's daughter Sari called and left message requesting a return call regarding this.

## 2023-10-22 NOTE — Telephone Encounter (Signed)
 Phoned the pt's daughter Sari back. No ans, LMOVM

## 2023-10-24 NOTE — Telephone Encounter (Signed)
 Phoned the pt's daughter and LMOVM to return call

## 2023-10-27 DIAGNOSIS — Z681 Body mass index (BMI) 19 or less, adult: Secondary | ICD-10-CM | POA: Diagnosis not present

## 2023-10-27 DIAGNOSIS — M4807 Spinal stenosis, lumbosacral region: Secondary | ICD-10-CM | POA: Diagnosis not present

## 2023-10-27 NOTE — Telephone Encounter (Signed)
 FYIBETHA Kung,  Phoned the pt's daughter and was advised that her mother has lost the lab forms, so I am faxing them to [facility] to her PCP before her appt with him. Also she wanted to know about the Amitiza . I advised her that her mother's insurance requires her to try several others first including the samples she was given. Pt's daughter will start her mother on Linzess  72 mcg and when done let us  kow how they worked.

## 2023-10-27 NOTE — Telephone Encounter (Signed)
 Returned the pt's call (her daughter) and LMOVM regarding medication and labs, appt. Waiting on a call back

## 2023-10-30 ENCOUNTER — Ambulatory Visit: Payer: Self-pay | Admitting: Gastroenterology

## 2023-11-04 DIAGNOSIS — E782 Mixed hyperlipidemia: Secondary | ICD-10-CM | POA: Diagnosis not present

## 2023-11-04 DIAGNOSIS — K219 Gastro-esophageal reflux disease without esophagitis: Secondary | ICD-10-CM | POA: Diagnosis not present

## 2023-11-04 DIAGNOSIS — F331 Major depressive disorder, recurrent, moderate: Secondary | ICD-10-CM | POA: Diagnosis not present

## 2023-11-04 DIAGNOSIS — E1165 Type 2 diabetes mellitus with hyperglycemia: Secondary | ICD-10-CM | POA: Diagnosis not present

## 2023-11-04 DIAGNOSIS — E039 Hypothyroidism, unspecified: Secondary | ICD-10-CM | POA: Diagnosis not present

## 2023-11-06 DIAGNOSIS — D559 Anemia due to enzyme disorder, unspecified: Secondary | ICD-10-CM | POA: Diagnosis not present

## 2023-11-06 DIAGNOSIS — E039 Hypothyroidism, unspecified: Secondary | ICD-10-CM | POA: Diagnosis not present

## 2023-11-06 DIAGNOSIS — Z1389 Encounter for screening for other disorder: Secondary | ICD-10-CM | POA: Diagnosis not present

## 2023-11-19 ENCOUNTER — Ambulatory Visit: Admitting: Gastroenterology

## 2023-11-19 ENCOUNTER — Encounter: Payer: Self-pay | Admitting: Gastroenterology

## 2023-11-19 VITALS — BP 107/56 | HR 53 | Temp 97.2°F | Ht 63.0 in | Wt 113.8 lb

## 2023-11-19 DIAGNOSIS — K5900 Constipation, unspecified: Secondary | ICD-10-CM | POA: Diagnosis not present

## 2023-11-19 DIAGNOSIS — R109 Unspecified abdominal pain: Secondary | ICD-10-CM | POA: Diagnosis not present

## 2023-11-19 MED ORDER — LINACLOTIDE 290 MCG PO CAPS: 290.0000 ug | ORAL_CAPSULE | Freq: Every day | ORAL | 0 refills | Status: DC

## 2023-11-19 MED ORDER — LINACLOTIDE 290 MCG PO CAPS: 290.0000 ug | ORAL_CAPSULE | Freq: Every day | ORAL | 3 refills | Status: DC

## 2023-11-19 NOTE — Progress Notes (Signed)
 Gastroenterology Office Note     Primary Care Physician:  Toribio Jerel MATSU, MD  Primary Gastroenterologist: Dr. Shaaron    Chief Complaint   Chief Complaint  Patient presents with   Follow-up    Pt arrives for follow up. No issues/concerns at this time. Weight is still about the same per pt     History of Present Illness   Tina Schultz is a 78 y.o. female presenting today with a history of constipation, abdominal pain, weight loss, hx of rectal bleeding, self-reported hx UC but not verified on colonoscopies, hx of dementia, last seen in Aug 2025 due to constipation and abdominal pain, completing CT abd/pelvis and labs with recommendations to start Amitiza .   CT without contrast Aug 2025: no acute findings. Labs had been requested including TSH, celiac, iron panel. Celiac was negative, Hgb 12.3, platelets 245, TSH low at 0.017, iron 128, iron sats 46, ferritin 327  BM every 3 days. Has to strain. Synthroid has been adjusted from 75 mcg down to 40 mcg following TSH level. Omeprazole 20 mg daily. No food fear or postprandial pain.   Amitiza  was not covered by insurance. Linzess  145 mcg not ideal, with BM every 3 days and straining. She would like to increase this.   Weight is stable since last visit and has been stable since April 2025. Prior to this, she was in the 130s. She does have history of dementia. Daughter present with her.    EGD July 2025: normal esophagus, normal stomach, normal duodenum   Colonoscopy July 2025: hemorrhoids, diverticula, normal appearing distal 5 cm TI, no specimens collected.    Colonoscopy 2021 with pancolonic diverticulosis, normal TI, benign colonic biopsies.    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   Depression    Diabetes mellitus without complication (HCC)    Dysrhythmia    Fibromyalgia    GERD (gastroesophageal reflux  disease)    Glaucoma    Hypertension    Hypothyroidism    IBS (irritable bowel syndrome)    Polymyalgia rheumatica    Rheumatic fever    Stroke [facility]) 1991  1993   left sided weakness; memorey loss   Thyroid  disease    Ulcerative colitis (HCC)    per patient diagnosed at age 60    Past Surgical History:  Procedure Laterality Date   ABDOMINAL HYSTERECTOMY     BIOPSY  06/03/2019   Procedure: BIOPSY;  Surgeon: Shaaron Lamar HERO, MD;  [facility];  Service: Endoscopy;;   CATARACT EXTRACTION W/PHACO Right 10/15/2016   Procedure: CATARACT EXTRACTION PHACO AND INTRAOCULAR LENS PLACEMENT (IOC);  Surgeon: Roz Anes, MD;  Location: [facility];  Service: Ophthalmology;  Laterality: Right;  CDE: 7.56   CATARACT EXTRACTION W/PHACO Left 01/14/2017   Procedure: CATARACT EXTRACTION PHACO AND INTRAOCULAR LENS PLACEMENT (IOC);  Surgeon: Roz Anes, MD;  [facility]: [facility];  Service: Ophthalmology;  Laterality: Left;  CDE: 6.28   CHOLECYSTECTOMY     COLONOSCOPY  2013   Dr. Donnel: mild left-sided diverticulosis, multiple biopsies with path unremarkable.    COLONOSCOPY N/A 08/25/2023   Procedure: COLONOSCOPY;  Surgeon: Shaaron Lamar HERO, MD;  [facility];  Service: Endoscopy;  Laterality: N/A;  12:45pm, asa 3   COLONOSCOPY WITH PROPOFOL  N/A 06/03/2019   pancolonic diverticulosis, normal TI. S/p biopsies. Benign.  ELBOW DEBRIDEMENT Right    ESOPHAGOGASTRODUODENOSCOPY N/A 08/25/2023   Procedure: EGD (ESOPHAGOGASTRODUODENOSCOPY);  Surgeon: Shaaron Lamar HERO, MD;  [facility];  Service: Endoscopy;  Laterality: N/A;   THYROID  SURGERY     partial thyroidectomy age 85.    Current Outpatient Medications  Medication Sig Dispense Refill   alendronate (FOSAMAX) 70 MG tablet Take 70 mg by mouth every Wednesday. Take with a full glass of water on an empty stomach.     aspirin-acetaminophen -caffeine (EXCEDRIN MIGRAINE) 250-250-65 MG tablet Take by mouth every 6 (six) hours as needed  for headache.     atenolol  (TENORMIN ) 25 MG tablet TAKE ONE TABLET BY MOUTH TWICE DAILY *dose changed start with NEXT fill 180 tablet 1   diphenhydramine-acetaminophen  (TYLENOL  PM) 25-500 MG TABS tablet Take 1 tablet by mouth at bedtime.     estradiol (ESTRACE) 0.5 MG tablet Take 0.5 mg by mouth at bedtime.      HYDROcodone -acetaminophen  (NORCO/VICODIN) 5-325 MG tablet Take 1 tablet by mouth every 6 (six) hours as needed (headache not relieved by tylenol  alone). 10 tablet 0   latanoprost (XALATAN) 0.005 % ophthalmic solution Place 1 drop into both eyes at bedtime.     levothyroxine (SYNTHROID) 75 MCG tablet Take 75 mcg by mouth daily. (Patient taking differently: Take 40 mcg by mouth daily.)     linaclotide  (LINZESS ) 145 MCG CAPS capsule Take 1 capsule (145 mcg total) by mouth daily before breakfast. 30 capsule 5   lubiprostone  (AMITIZA ) 8 MCG capsule Take 1 capsule (8 mcg total) by mouth 2 (two) times daily with a meal. 60 capsule 3   midodrine  (PROAMATINE ) 2.5 MG tablet TAKE 1 TABLET (2.5 MG TOTAL) BY MOUTH 2 (TWO) TIMES DAILY WITH A MEAL. 180 tablet 3   omeprazole (PRILOSEC) 20 MG capsule Take 20 mg by mouth daily.     predniSONE (DELTASONE) 1 MG tablet Take 4 mg by mouth daily.     SUMAtriptan (IMITREX) 100 MG tablet Take 100 mg by mouth 2 (two) times daily as needed.     venlafaxine (EFFEXOR) 75 MG tablet Take 75 mg by mouth daily.     denosumab  (PROLIA ) 60 MG/ML SOSY injection Inject 60 mg into the skin every 6 (six) months. (Patient not taking: Reported on 11/19/2023)     folic acid (FOLVITE) 1 MG tablet Take 1 mg by mouth daily. (Patient not taking: Reported on 11/19/2023)     No current facility-administered medications for this visit.   Facility-Administered Medications Ordered in Other Visits  Medication Dose Route Frequency Provider Last Rate Last Admin   ondansetron  (ZOFRAN ) 4 mg in sodium chloride  0.9 % 50 mL IVPB  4 mg Intravenous Q6H PRN Gillie Duncans, MD        Allergies as  of 11/19/2023 - Review Complete 11/19/2023  Allergen Reaction Noted   Iodinated contrast media Nausea And Vomiting and Other (See Comments) 03/15/2013   Aspirin Other (See Comments) 03/15/2013   Codeine Nausea And Vomiting 03/15/2013   Morphine and codeine Nausea And Vomiting 03/15/2013   Sulfa antibiotics Nausea And Vomiting and Nausea Only 03/15/2013   Benadryl [diphenhydramine] Other (See Comments) 01/04/2020   Morphine  03/14/2015   Nsaids  05/10/2020   Zofran  [ondansetron ] Hives 09/12/2023    Family History  Problem Relation Age of Onset   Cancer Mother    Stroke Father    Addison's disease Sister    Alzheimer's disease Brother    Colon cancer Niece  deceased at 49     Social History   Socioeconomic History   Marital status: Widowed    Spouse name: Not on file   Number of children: Not on file   Years of education: Not on file   Highest education level: Not on file  Occupational History   Occupation: retired    Comment: [employer], cook  Tobacco Use   Smoking status: Former    Current packs/day: 0.00    Types: Cigarettes    Quit date: 12/08/1989    Years since quitting: 33.9    Passive exposure: Never   Smokeless tobacco: Never  Vaping Use   Vaping status: Never Used  Substance and Sexual Activity   Alcohol  use: No   Drug use: No   Sexual activity: Never  Other Topics Concern   Not on file  Social History Narrative   Lives alone.   Social Drivers of Corporate Investment Banker Strain: Not on file  Food Insecurity: Not on file  Transportation Needs: Not on file  Physical Activity: Not on file  Stress: Not on file  Social Connections: Not on file  Intimate Partner Violence: Not on file     Review of Systems   Gen: Denies any fever, chills, fatigue, weight loss, lack of appetite.  CV: Denies chest pain, heart palpitations, peripheral edema, syncope.  Resp: Denies shortness of breath at rest or with exertion. Denies wheezing or cough.   GI: Denies dysphagia or odynophagia. Denies jaundice, hematemesis, fecal incontinence. GU : Denies urinary burning, urinary frequency, urinary hesitancy MS: Denies joint pain, muscle weakness, cramps, or limitation of movement.  Derm: Denies rash, itching, dry skin Psych: Denies depression, anxiety, memory loss, and confusion Heme: Denies bruising, bleeding, and enlarged lymph nodes.   Physical Exam   BP (!) 107/56   Pulse (!) 53   Temp (!) 97.2 F (36.2 C)   Ht 5' 3 (1.6 m)   Wt 113 lb 12.8 oz (51.6 kg)   BMI 20.16 kg/m  General:   Alert and oriented. Pleasant and cooperative. Well-nourished and well-developed.  Head:  Normocephalic and atraumatic. Eyes:  Without icterus Abdomen:  +BS, soft, TTP lower abdomen out of proportion with exam, no tenderness with stethescope but did have with light tiouch Rectal:  Deferred  Msk:  Symmetrical without gross deformities. Normal posture. Extremities:  Without edema. Neurologic:  Alert and  oriented x4;  grossly normal neurologically. Skin:  Intact without significant lesions or rashes. Psych:  Alert and cooperative. Normal mood and affect.   Assessment    Chronic constipation: not ideally managed on Linzess  145 mcg and Amitiza  not covered by insurance  Weight loss: stabilized and holding steady since April 2025, no concerns for chronic mesenteric ischemia, continue to follow closely.      PLAN    Increase Linzess  to 290 mcg daily and samples provided. Rx sent to pharmacy Call if no improvement Return in 3 months   Tina MICAEL Stager, PhD, [facility] Gastroenter[facility]

## 2023-11-19 NOTE — Patient Instructions (Signed)
 I have increased Linzess  to 290 mcg. Take one capsule each morning, 30 minutes before breakfast. I provided samples and sent in a prescription.  Linzess  works best when taken once a day every day, on an empty stomach, at least 30 minutes before your first meal of the day.  When Linzess  is taken daily as directed:  *Constipation relief is typically felt in about a week *IBS-C patients may begin to experience relief from belly pain and overall abdominal symptoms (pain, discomfort, and bloating) in about 1 week,   with symptoms typically improving over 12 weeks.  Diarrhea may occur in the first 2 weeks -keep taking it.  The diarrhea should go away and you should start having normal, complete, full bowel movements. It may be helpful to start treatment when you can be near the comfort of your own bathroom, such as a weekend.    We will see you in 3 months! Please update with any concerns!@  Happy early birthday!   I enjoyed seeing you again today! I value our relationship and want to provide genuine, compassionate, and quality care. You may receive a survey regarding your visit with me, and I welcome your feedback! Thanks so much for taking the time to complete this. I look forward to seeing you again.      Therisa MICAEL Stager, PhD, ANP-BC [facility] Gastroenterology

## 2023-11-20 DIAGNOSIS — M48062 Spinal stenosis, lumbar region with neurogenic claudication: Secondary | ICD-10-CM | POA: Diagnosis not present

## 2024-01-14 ENCOUNTER — Encounter: Payer: Self-pay | Admitting: Neurology

## 2024-01-14 ENCOUNTER — Ambulatory Visit: Admitting: Neurology

## 2024-01-14 VITALS — BP 132/62 | HR 58 | Ht 63.0 in | Wt 115.0 lb

## 2024-01-14 DIAGNOSIS — G43009 Migraine without aura, not intractable, without status migrainosus: Secondary | ICD-10-CM | POA: Diagnosis not present

## 2024-01-14 DIAGNOSIS — M353 Polymyalgia rheumatica: Secondary | ICD-10-CM | POA: Insufficient documentation

## 2024-01-14 DIAGNOSIS — M810 Age-related osteoporosis without current pathological fracture: Secondary | ICD-10-CM | POA: Insufficient documentation

## 2024-01-14 DIAGNOSIS — M797 Fibromyalgia: Secondary | ICD-10-CM | POA: Insufficient documentation

## 2024-01-14 DIAGNOSIS — M25559 Pain in unspecified hip: Secondary | ICD-10-CM | POA: Insufficient documentation

## 2024-01-14 MED ORDER — NURTEC 75 MG PO TBDP: 75.0000 mg | ORAL_TABLET | ORAL | 11 refills | Status: AC

## 2024-01-14 MED ORDER — SUMATRIPTAN SUCCINATE 100 MG PO TABS: 100.0000 mg | ORAL_TABLET | Freq: Two times a day (BID) | ORAL | 11 refills | Status: AC | PRN

## 2024-01-14 NOTE — Progress Notes (Signed)
 [facility]  PATIENT: Tina Schultz DOB: [birth year]  REQUESTING CLINICIAN: Suzette Pac, MD HISTORY FROM: Patient and daughter REASON FOR VISIT: Worsening migraine   HISTORICAL  CHIEF COMPLAINT:  Chief Complaint  Patient presents with   RM 12    Consult: migraines since childhood; worsening since August; does take Imitrex, not helping; no hx of head injury; with family member    HISTORY OF PRESENT ILLNESS:  Discussed the use of AI scribe software for clinical note transcription with the patient, who gave verbal consent to proceed.  Tina Schultz is a 78 year old female with history of migraines, hypothyroidism, depression, who presents with worsening migraine headaches.  She has experienced migraines since childhood, but since August, she has developed a new type of headache. This headache starts at the vertex, feels like it is going to 'bust', and occurs approximately every two weeks. Each episode lasts about three to four days, sometimes longer.  Prior to August, her migraines occurred every two months and were manageable with Imitrex, which typically resolved the headache within a day. However, the new headaches do not respond to Imitrex or extra strength Tylenol .  In the past two weeks, she has experienced two regular migraines and three episodes of the new headache. The new headaches are associated with lightheadedness and a sensation similar to having a stroke, which once prompted her to visit the hospital. An MRI was conducted at that time did not show any acute abnormality.  She has previously tried preventive medications including amitriptyline, venlafaxine and gabapentin, but discontinued gabapentin two years ago due to side effects, including memory issues.  She has not tried any other abortive medication other than Imitrex and Excedrin Tylenol .      OTHER MEDICAL CONDITIONS: Migraine headaches, hypothyroidism, fibromyalgia,  depression   REVIEW OF SYSTEMS: Full 14 system review of systems performed and negative with exception of: As noted in the HPI  ALLERGIES: Allergies  Allergen Reactions   Iodinated Contrast Media Nausea And Vomiting and Other (See Comments)    Increased heart rate and sweating a long time ago.  01/25/14 pt states she went out after receiving iv contrast 25 yrs ago and was told by [facility] to never have it again  Other Reaction(s): GI Intolerance, Other (See Comments)   Aspirin Other (See Comments)    Bleeding precautions secondary to ulcerative colitis.   Codeine Nausea And Vomiting    Other Reaction(s): GI Intolerance   Morphine And Codeine Nausea And Vomiting   Sulfa Antibiotics Nausea And Vomiting and Nausea Only    GI Intolerance   Benadryl [Diphenhydramine] Other (See Comments)    Makes me feel like I'm having a seizure.   Morphine     Other Reaction(s): GI Intolerance   Nsaids     Other Reaction(s): GI Intolerance   Zofran  [Ondansetron ] Hives    HOME MEDICATIONS: Outpatient Medications Prior to Visit  Medication Sig Dispense Refill   alendronate (FOSAMAX) 70 MG tablet Take 70 mg by mouth every Wednesday. Take with a full glass of water on an empty stomach.     aspirin-acetaminophen -caffeine (EXCEDRIN MIGRAINE) 250-250-65 MG tablet Take by mouth every 6 (six) hours as needed for headache.     atenolol  (TENORMIN ) 25 MG tablet TAKE ONE TABLET BY MOUTH TWICE DAILY *dose changed start with NEXT fill 180 tablet 1   diphenhydramine-acetaminophen  (TYLENOL  PM) 25-500 MG TABS tablet Take 1 tablet by mouth at bedtime.     donepezil (ARICEPT) 5 MG  tablet Take 5 mg by mouth at bedtime.     estradiol (ESTRACE) 0.5 MG tablet Take 0.5 mg by mouth at bedtime.      folic acid (FOLVITE) 1 MG tablet Take 1 mg by mouth daily.     HYDROcodone -acetaminophen  (NORCO/VICODIN) 5-325 MG tablet Take 1 tablet by mouth every 6 (six) hours as needed (headache not relieved by tylenol  alone).  10 tablet 0   levothyroxine (SYNTHROID) 50 MCG tablet Take 50 mcg by mouth daily.     linaclotide  (LINZESS ) 145 MCG CAPS capsule Take 1 capsule (145 mcg total) by mouth daily before breakfast. 30 capsule 5   linaclotide  (LINZESS ) 290 MCG CAPS capsule Take 1 capsule (290 mcg total) by mouth daily before breakfast. 90 capsule 3   midodrine  (PROAMATINE ) 2.5 MG tablet TAKE 1 TABLET (2.5 MG TOTAL) BY MOUTH 2 (TWO) TIMES DAILY WITH A MEAL. 180 tablet 3   omeprazole (PRILOSEC) 20 MG capsule Take 20 mg by mouth daily.     predniSONE (DELTASONE) 1 MG tablet Take 4 mg by mouth daily.     venlafaxine (EFFEXOR) 75 MG tablet Take 75 mg by mouth daily.     SUMAtriptan (IMITREX) 100 MG tablet Take 100 mg by mouth 2 (two) times daily as needed.     denosumab  (PROLIA ) 60 MG/ML SOSY injection Inject 60 mg into the skin every 6 (six) months. (Patient not taking: Reported on 01/14/2024)     latanoprost (XALATAN) 0.005 % ophthalmic solution Place 1 drop into both eyes at bedtime.     levothyroxine (SYNTHROID) 75 MCG tablet Take 75 mcg by mouth daily. (Patient taking differently: Take 40 mcg by mouth daily.)     linaclotide  (LINZESS ) 290 MCG CAPS capsule Take 1 capsule (290 mcg total) by mouth daily before breakfast. 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   Depression    Diabetes mellitus without complication (HCC)    Dysrhythmia    Fibromyalgia    GERD (gastroesophageal reflux disease)    Glaucoma    Hypertension    Hypothyroidism    IBS (irritable bowel syndrome)    Polymyalgia rheumatica    Rheumatic fever    Stroke (HCC) 1991   1993   left sided weakness; memorey loss   Thyroid  disease    Ulcerative colitis (HCC)    per patient diagnosed at age 87    PAST SURGICAL HISTORY: Past Surgical History:  Procedure Laterality Date   ABDOMINAL HYSTERECTOMY     BIOPSY  06/03/2019   Procedure: BIOPSY;  Surgeon: Shaaron Lamar HERO, MD;  [facility];  Service: Endoscopy;;   CATARACT EXTRACTION W/PHACO Right 10/15/2016   Procedure: CATARACT EXTRACTION PHACO AND INTRAOCULAR LENS PLACEMENT (IOC);  Surgeon: Roz Anes, MD;  Location: [facility];  Service: Ophthalmology;  Laterality: Right;  CDE: 7.56   CATARACT EXTRACTION W/PHACO Left 01/14/2017   Procedure: CATARACT EXTRACTION PHACO AND INTRAOCULAR LENS PLACEMENT (IOC);  Surgeon: Roz Anes, MD;  Location: [facility];  Service: Ophthalmology;  Laterality: Left;  CDE: 6.28  CHOLECYSTECTOMY     COLONOSCOPY  2013   Dr. Donnel: mild left-sided diverticulosis, multiple biopsies with path unremarkable.    COLONOSCOPY N/A 08/25/2023   Procedure: COLONOSCOPY;  Surgeon: Shaaron Lamar HERO, MD;  [facility];  Service: Endoscopy;  Laterality: N/A;  12:45pm, asa 3   COLONOSCOPY WITH PROPOFOL  N/A 06/03/2019   pancolonic diverticulosis, normal TI. S/p biopsies. Benign.    ELBOW DEBRIDEMENT Right    ESOPHAGOGASTRODUODENOSCOPY N/A 08/25/2023   Procedure: EGD (ESOPHAGOGASTRODUODENOSCOPY);  Surgeon: Shaaron Lamar HERO, MD;  [facility];  Service: Endoscopy;  Laterality: N/A;   THYROID  SURGERY     partial thyroidectomy age 78.    FAMILY HISTORY: Family History  Problem Relation Age of Onset   Cancer Mother    Stroke Father    Addison's disease Sister    Alzheimer's disease Brother    Colon cancer Niece        deceased at 61     SOCIAL HISTORY: Social History   Socioeconomic History   Marital status: Widowed    Spouse name: Not on file   Number of children: Not on file   Years of education: Not on file   Highest education level: Not on file  Occupational  History   Occupation: retired    Comment: [employer], cook  Tobacco Use   Smoking status: Former    Current packs/day: 0.00    Types: Cigarettes    Quit date: 12/08/1989    Years since quitting: 34.1    Passive exposure: Never   Smokeless tobacco: Never  Vaping Use   Vaping status: Never Used  Substance and Sexual Activity   Alcohol  use: No   Drug use: No   Sexual activity: Never  Other Topics Concern   Not on file  Social History Narrative   Lives alone.   Social Drivers of Corporate Investment Banker Strain: Not on file  Food Insecurity: Not on file  Transportation Needs: Not on file  Physical Activity: Not on file  Stress: Not on file  Social Connections: Not on file  Intimate Partner Violence: Not on file    PHYSICAL EXAM  GENERAL EXAM/CONSTITUTIONAL: Vitals:  Vitals:   01/14/24 1508  BP: 132/62  Pulse: (!) 58  Weight: 115 lb (52.2 kg)  Height: 5' 3 (1.6 m)   Body mass index is 20.37 kg/m. Wt Readings from Last 3 Encounters:  01/14/24 115 lb (52.2 kg)  11/19/23 113 lb 12.8 oz (51.6 kg)  10/01/23 113 lb 6.4 oz (51.4 kg)   Patient is in no distress; well developed, nourished and groomed; neck is supple  MUSCULOSKELETAL: Gait, strength, tone, movements noted in Neurologic exam below  NEUROLOGIC: MENTAL STATUS:      No data to display         awake, alert, oriented to person, place and time recent and remote memory intact normal attention and concentration language fluent, comprehension intact, naming intact fund of knowledge appropriate  CRANIAL NERVE:  2nd - no papilledema or hemorrhages on fundoscopic exam 2nd, 3rd, 4th, 6th - pupils equal and reactive to light, visual fields full to confrontation, extraocular muscles intact, no nystagmus 5th - facial sensation symmetric 7th - facial strength symmetric 8th - hearing intact 9th - palate elevates symmetrically, uvula midline 11th - shoulder shrug symmetric 12th - tongue protrusion  midline  MOTOR:  normal bulk and tone, full strength in the BUE, BLE  SENSORY:  normal and symmetric to light touch, pinprick  COORDINATION:  finger-nose-finger, fine finger movements normal  GAIT/STATION:  normal   DIAGNOSTIC DATA (LABS, IMAGING, TESTING) - I reviewed patient records, labs, notes, testing and imaging myself where available.  Lab Results  Component Value Date   WBC 8.3 09/12/2023   HGB 11.3 (L) 09/12/2023   HCT 33.3 (L) 09/12/2023   MCV 92.8 09/12/2023   PLT 232 09/12/2023      Component Value Date/Time   NA 139 09/12/2023 1207   K 4.1 09/12/2023 1207   CL 104 09/12/2023 1207   CO2 24 09/12/2023 1207   GLUCOSE 103 (H) 09/12/2023 1207   BUN 14 09/12/2023 1207   CREATININE 0.87 09/12/2023 1207   CALCIUM 9.2 09/12/2023 1207   PROT 6.3 (L) 09/12/2023 1207   ALBUMIN 3.8 09/12/2023 1207   AST 23 09/12/2023 1207   ALT 14 09/12/2023 1207   ALKPHOS 35 (L) 09/12/2023 1207   BILITOT 0.7 09/12/2023 1207   GFRNONAA >60 09/12/2023 1207   GFRAA >60 05/31/2019 1043   No results found for: CHOL, HDL, LDLCALC, LDLDIRECT, TRIG, CHOLHDL No results found for: YHAJ8R No results found for: VITAMINB12 Lab Results  Component Value Date   TSH 1.167 03/28/2016    MRI Brain MRA Head 09/12/2023 1. No acute intracranial abnormality. 2. 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 No evidence of dissection or large vessel occlusion.    ASSESSMENT AND PLAN  78 y.o. year old female with    Migraine without aura Migraines since childhood with recent increase in frequency and severity since August. New headaches occur approximately every two weeks, lasting 3-5 days, with lightheadedness and a sensation of head pressure. Previous MRI and MRA showed no acute findings. Imitrex is ineffective for new headaches. Previous preventive medications include amitriptyline, venlafaxine and gabapentin, with gabapentin discontinued due to memory issues.  Nurtec, a CGRP inhibitor, is considered for preventive treatment.  - Prescribed Nurtec every other day for migraine prevention. - Refilled Imitrex for abortive treatment of migraines. - Advised hospital evaluation if severe headache symptoms occur, such as head pressure associated with weakness, to rule out stroke or other serious conditions. - Scheduled follow-up in six months, with instructions to update if medication is ineffective.    1. Migraine without aura and without status migrainosus, not intractable     Patient Instructions  Start Nurtec every other day as migraine prevention  Continue with Imitrex as needed for the headaches  Please call with updates  Continue your other medications  Return as needed    No orders of the defined types were placed in this encounter.   Meds ordered this encounter  Medications   Rimegepant Sulfate (NURTEC) 75 MG TBDP    Sig: Take 1 tablet (75 mg total) by mouth every other day.    Dispense:  16 tablet    Refill:  11   SUMAtriptan (IMITREX) 100 MG tablet    Sig: Take 1 tablet (100 mg total) by mouth 2 (two) times daily as needed.    Dispense:  10 tablet    Refill:  11    Return in about 6 months (around 07/14/2024).    Pastor Falling, MD 01/14/2024, 3:36 PM  [facility] [address], [address] [place], [place] [address] [phone]

## 2024-01-14 NOTE — Patient Instructions (Signed)
 Start Nurtec every other day as migraine prevention  Continue with Imitrex as needed for the headaches  Please call with updates  Continue your other medications  Return as needed

## 2024-01-15 ENCOUNTER — Other Ambulatory Visit (HOSPITAL_COMMUNITY): Payer: Self-pay

## 2024-01-15 ENCOUNTER — Telehealth: Payer: Self-pay | Admitting: Pharmacist

## 2024-01-15 NOTE — Telephone Encounter (Signed)
 Pharmacy Patient Advocate Encounter  Received notification from [facility] that Prior Authorization for Rimegepant Sulfate (NURTEC) 75 MG TBDP has been APPROVED from 01/15/2024 to 04/14/2024   PA #/Case ID/Reference #: [id number]

## 2024-01-15 NOTE — Telephone Encounter (Signed)
 Pharmacy Patient Advocate Encounter   Received notification from Patient Pharmacy that prior authorization for Nurtec 75MG  dispersible tablets is required/requested.   Insurance verification completed.   The patient is insured through [facility].   Per test claim: PA required; PA submitted to above mentioned insurance via Latent Key/confirmation #/EOC [id number] Status is pending

## 2024-02-11 ENCOUNTER — Encounter: Payer: Self-pay | Admitting: Gastroenterology

## 2024-02-11 ENCOUNTER — Ambulatory Visit: Admitting: Gastroenterology

## 2024-02-11 VITALS — BP 118/64 | HR 58 | Temp 97.3°F | Ht 64.0 in | Wt 116.4 lb

## 2024-02-11 DIAGNOSIS — Z8719 Personal history of other diseases of the digestive system: Secondary | ICD-10-CM

## 2024-02-11 DIAGNOSIS — K5909 Other constipation: Secondary | ICD-10-CM

## 2024-02-11 DIAGNOSIS — K5900 Constipation, unspecified: Secondary | ICD-10-CM

## 2024-02-11 NOTE — Progress Notes (Signed)
 "   Gastroenterology Office Note     Primary Care Physician:  Toribio Jerel MATSU, MD  Primary Gastroenterologist: DR Shaaron    Chief Complaint   Chief Complaint  Patient presents with   Follow-up    Patient here today for a follow up on constipation, which patient states she is still having issues with. Patient was prescribed linzess , but was unable to afford as it cost over $ 400.00. She does use Miralax as needed. Patient also has some lower abdominal pain at times.      History of Present Illness   Tina Schultz is a 79 y.o. female presenting today with a history of constipation, abdominal pain, weight loss, hx of rectal bleeding, self-reported hx UC but not verified on colonoscopies, hx of dementia, last seen in Oct 2025 for constipation not ideally managed on Linzess  145 mcg and Amitiza  not covered by insurance .  CT without contrast Aug 2025: no acute findings. Labs had been requested including TSH, celiac, iron panel. Celiac was negative, Hgb 12.3, platelets 245, TSH low at 0.017, iron 128, iron sats 46, ferritin 327   Neither Amitiza  nor Linzess  covered by insurance. Linzess  was $400.00.   This morning had diarrhea for a change. BM every 3-4 days, typically every 4 days. Not taking Miralax. Only thing that helps her go to bathroom is dulcolax tablets. Takes this about every 3 days.   Hx of weight loss now stabilized. Increasing weight. 2023/2024 was in the 130s/140s. 2025 has been in the mid 1teens and stabilized.   EGD July 2025: normal esophagus, normal stomach, normal duodenum   Colonoscopy July 2025: hemorrhoids, diverticula, normal appearing distal 5 cm TI, no specimens collected.    Colonoscopy 2021 with pancolonic diverticulosis, normal TI, benign colonic biopsies.   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   Depression    Diabetes mellitus without  complication (HCC)    Dysrhythmia    Fibromyalgia    GERD (gastroesophageal reflux disease)    Glaucoma    Hypertension    Hypothyroidism    IBS (irritable bowel syndrome)    Polymyalgia rheumatica    Rheumatic fever    Stroke (HCC) 1991  1993   left sided weakness; memorey loss   Thyroid  disease    Ulcerative colitis (HCC)    per patient diagnosed at age 53    Past Surgical History:  Procedure Laterality Date   ABDOMINAL HYSTERECTOMY     BIOPSY  06/03/2019   Procedure: BIOPSY;  Surgeon: Shaaron Lamar HERO, MD;  [facility];  Service: Endoscopy;;   CATARACT EXTRACTION W/PHACO Right 10/15/2016   Procedure: CATARACT EXTRACTION PHACO AND INTRAOCULAR LENS PLACEMENT (IOC);  Surgeon: Roz Anes, MD;  Location: [facility];  Service: Ophthalmology;  Laterality: Right;  CDE: 7.56   CATARACT EXTRACTION W/PHACO Left 01/14/2017   Procedure: CATARACT EXTRACTION PHACO AND INTRAOCULAR LENS PLACEMENT (IOC);  Surgeon: Roz Anes, MD;  Location: [facility];  Service: Ophthalmology;  Laterality: Left;  CDE: 6.28   CHOLECYSTECTOMY     COLONOSCOPY  2013   Dr. Donnel: mild left-sided diverticulosis, multiple biopsies with path unremarkable.    COLONOSCOPY N/A 08/25/2023   Procedure: COLONOSCOPY;  Surgeon: Shaaron Lamar HERO, MD;  [facility];  Service: Endoscopy;  Laterality: N/A;  12:45pm, asa 3   COLONOSCOPY WITH PROPOFOL   N/A 06/03/2019   pancolonic diverticulosis, normal TI. S/p biopsies. Benign.    ELBOW DEBRIDEMENT Right    ESOPHAGOGASTRODUODENOSCOPY N/A 08/25/2023   Procedure: EGD (ESOPHAGOGASTRODUODENOSCOPY);  Surgeon: Shaaron Lamar HERO, MD;  [facility];  Service: Endoscopy;  Laterality: N/A;   THYROID  SURGERY     partial thyroidectomy age 62.    Current Outpatient Medications  Medication Sig Dispense Refill   alendronate (FOSAMAX) 70 MG tablet Take 70 mg by mouth every Wednesday. Take with a full glass of water on an empty stomach.     aspirin-acetaminophen -caffeine  (EXCEDRIN MIGRAINE) 250-250-65 MG tablet Take by mouth every 6 (six) hours as needed for headache.     atenolol  (TENORMIN ) 25 MG tablet TAKE ONE TABLET BY MOUTH TWICE DAILY *dose changed start with NEXT fill 180 tablet 1   diphenhydramine-acetaminophen  (TYLENOL  PM) 25-500 MG TABS tablet Take 1 tablet by mouth at bedtime.     donepezil (ARICEPT) 5 MG tablet Take 5 mg by mouth at bedtime.     estradiol (ESTRACE) 0.5 MG tablet Take 0.5 mg by mouth at bedtime.      folic acid (FOLVITE) 1 MG tablet Take 1 mg by mouth daily.     HYDROcodone -acetaminophen  (NORCO/VICODIN) 5-325 MG tablet Take 1 tablet by mouth every 6 (six) hours as needed (headache not relieved by tylenol  alone). 10 tablet 0   levothyroxine (SYNTHROID) 50 MCG tablet Take 50 mcg by mouth daily.     midodrine  (PROAMATINE ) 2.5 MG tablet TAKE 1 TABLET (2.5 MG TOTAL) BY MOUTH 2 (TWO) TIMES DAILY WITH A MEAL. 180 tablet 3   omeprazole (PRILOSEC) 20 MG capsule Take 20 mg by mouth daily.     predniSONE (DELTASONE) 1 MG tablet Take 4 mg by mouth daily.     Rimegepant Sulfate (NURTEC) 75 MG TBDP Take 1 tablet (75 mg total) by mouth every other day. 16 tablet 11   SUMAtriptan  (IMITREX ) 100 MG tablet Take 1 tablet (100 mg total) by mouth 2 (two) times daily as needed. 10 tablet 11   venlafaxine (EFFEXOR) 75 MG tablet Take 75 mg by mouth daily.     No current facility-administered medications for this visit.   Facility-Administered Medications Ordered in Other Visits  Medication Dose Route Frequency Provider Last Rate Last Admin   ondansetron  (ZOFRAN ) 4 mg in sodium chloride  0.9 % 50 mL IVPB  4 mg Intravenous Q6H PRN Gillie Duncans, MD        Allergies as of 02/11/2024 - Review Complete 02/11/2024  Allergen Reaction Noted   Iodinated contrast media Nausea And Vomiting and Other (See Comments) 03/15/2013   Aspirin Other (See Comments) 03/15/2013   Codeine Nausea And Vomiting 03/15/2013   Morphine and codeine Nausea And Vomiting 03/15/2013    Sulfa antibiotics Nausea And Vomiting and Nausea Only 03/15/2013   Benadryl [diphenhydramine] Other (See Comments) 01/04/2020   Morphine  03/14/2015   Nsaids  05/10/2020   Zofran  [ondansetron ] Hives 09/12/2023    Family History  Problem Relation Age of Onset   Cancer Mother    Stroke Father    Addison's disease Sister    Alzheimer's disease Brother    Colon cancer Niece        deceased at 32     Social History   Socioeconomic History   Marital status: Widowed    Spouse name: Not on file   Number of children: Not on file   Years of education: Not on file   Highest education level: Not on  file  Occupational History   Occupation: retired    Comment: [employer]  Tobacco Use   Smoking status: Former    Current packs/day: 0.00    Types: Cigarettes    Quit date: 12/08/1989    Years since quitting: 34.2    Passive exposure: Never   Smokeless tobacco: Never  Vaping Use   Vaping status: Never Used  Substance and Sexual Activity   Alcohol  use: No   Drug use: No   Sexual activity: Never  Other Topics Concern   Not on file  Social History Narrative   Lives alone.   Social Drivers of Health   Tobacco Use: Medium Risk (02/11/2024)   Patient History    Smoking Tobacco Use: Former    Smokeless Tobacco Use: Never    Passive Exposure: Never  Programmer, Applications: Not on Ship [employer]ker Insecurity: Not on file  Transportation Needs: Not on file  Physical Activity: Not on file  Stress: Not on file  Social Connections: Not on file  Intimate Partner Violence: Not on file  Depression (EYV7-0): Not on file  Alcohol  Screen: Not on file  Housing: Not on file  Utilities: Not on file  Health Literacy: Not on file     Review of Systems   Gen: Denies any fever, chills, fatigue, weight loss, lack of appetite.  CV: Denies chest pain, heart palpitations, peripheral edema, syncope.  Resp: Denies shortness of breath at rest or with exertion. Denies wheezing or cough.   GI: Denies dysphagia or odynophagia. Denies jaundice, hematemesis, fecal incontinence. GU : Denies urinary burning, urinary frequency, urinary hesitancy MS: Denies joint pain, muscle weakness, cramps, or limitation of movement.  Derm: Denies rash, itching, dry skin Psych: Denies depression, anxiety, memory loss, and confusion Heme: Denies bruising, bleeding, and enlarged lymph nodes.   Physical Exam   BP 118/64 (BP Location: Left Arm, Patient Position: Sitting, Cuff Size: Normal)   Pulse (!) 58   Temp (!) 97.3 F (36.3 C) (Temporal)   Ht 5' 4 (1.626 m)   Wt 116 lb 6.4 oz (52.8 kg)   BMI 19.98 kg/m  General:   Alert and oriented. Pleasant and cooperative. Well-nourished and well-developed.  Head:  Normocephalic and atraumatic. Eyes:  Without icterus Abdomen:  +BS, soft, non-tender and non-distended. No HSM noted. No guarding or rebound. No masses appreciated.  Rectal:  Deferred  Msk:  Symmetrical without gross deformities. Normal posture. Extremities:  Without edema. Neurologic:  Alert and  oriented x4;  grossly normal neurologically. Skin:  Intact without significant lesions or rashes. Psych:  Alert and cooperative. Normal mood and affect.   Assessment/Plan:    Chronic constipation difficult to manage historically  Will trial Senokot daily, as other prescriptive agents too costly  Weight loss  Stabilized this year. Notably, TSH is low and managed by PCP  No concerns for chronic mesenteric ischemia, colonoscopy/EGD on file  CT unrevealing Aug 2025  Low concern for CMI but recommend CTA if any further weight loss  History of dementia  Likely contributing to overall presentation  Close follow-up in 3-4 months for constipation and weight check  Reported hx of UC  This has not been verified on prior procedures and recommend following clinically   Return in 3-4 months    Therisa MICAEL Stager, PhD, ANP-BC [facility]    "

## 2024-02-11 NOTE — Patient Instructions (Addendum)
 Username for MyChart is: [id number]   I would like for you to try Senokot-S 2 tablets each evening. We can increase or decrease this if needed.  Please let us  know how things go so we can adjust!  Happy upcoming [age]!  We will see you in 3-4 months or sooner if needed!   I enjoyed seeing you again today! I value our relationship and want to provide genuine, compassionate, and quality care. You may receive a survey regarding your visit with me, and I welcome your feedback! Thanks so much for taking the time to complete this. I look forward to seeing you again.      Therisa MICAEL Stager, PhD, ANP-BC [facility]

## 2024-08-18 ENCOUNTER — Ambulatory Visit: Admitting: Neurology
